# Patient Record
Sex: Female | Born: 1970 | Race: Black or African American | Hispanic: No | Marital: Married | Smoking: Never smoker
Health system: Southern US, Community
[De-identification: ages and names within clinical notes are randomized; demographics above are authoritative.]

## PROBLEM LIST (undated history)

## (undated) DIAGNOSIS — D649 Anemia, unspecified: Secondary | ICD-10-CM

## (undated) HISTORY — PX: OVARIAN CYST SURGERY: SHX726

## (undated) HISTORY — DX: Anemia, unspecified: D64.9

---

## 1998-01-11 ENCOUNTER — Ambulatory Visit (HOSPITAL_COMMUNITY): Admission: RE | Admit: 1998-01-11 | Discharge: 1998-01-11 | Payer: Self-pay | Admitting: Obstetrics and Gynecology

## 1998-01-18 ENCOUNTER — Encounter: Payer: Self-pay | Admitting: Obstetrics and Gynecology

## 1998-01-19 ENCOUNTER — Inpatient Hospital Stay (HOSPITAL_COMMUNITY): Admission: RE | Admit: 1998-01-19 | Discharge: 1998-01-22 | Payer: Self-pay | Admitting: Obstetrics and Gynecology

## 1998-12-12 ENCOUNTER — Other Ambulatory Visit: Admission: RE | Admit: 1998-12-12 | Discharge: 1998-12-12 | Payer: Self-pay | Admitting: Obstetrics and Gynecology

## 1999-01-17 ENCOUNTER — Encounter: Payer: Self-pay | Admitting: Obstetrics and Gynecology

## 1999-01-17 ENCOUNTER — Encounter: Admission: RE | Admit: 1999-01-17 | Discharge: 1999-01-17 | Payer: Self-pay | Admitting: Obstetrics and Gynecology

## 2000-01-23 ENCOUNTER — Other Ambulatory Visit: Admission: RE | Admit: 2000-01-23 | Discharge: 2000-01-23 | Payer: Self-pay | Admitting: Obstetrics and Gynecology

## 2000-02-14 ENCOUNTER — Encounter: Payer: Self-pay | Admitting: Obstetrics and Gynecology

## 2000-02-14 ENCOUNTER — Ambulatory Visit (HOSPITAL_COMMUNITY): Admission: RE | Admit: 2000-02-14 | Discharge: 2000-02-14 | Payer: Self-pay | Admitting: Obstetrics and Gynecology

## 2000-09-06 ENCOUNTER — Encounter: Payer: Self-pay | Admitting: Obstetrics and Gynecology

## 2000-09-06 ENCOUNTER — Ambulatory Visit (HOSPITAL_COMMUNITY): Admission: RE | Admit: 2000-09-06 | Discharge: 2000-09-06 | Payer: Self-pay | Admitting: Obstetrics and Gynecology

## 2001-04-21 ENCOUNTER — Inpatient Hospital Stay (HOSPITAL_COMMUNITY): Admission: AD | Admit: 2001-04-21 | Discharge: 2001-04-23 | Payer: Self-pay | Admitting: Obstetrics and Gynecology

## 2001-04-24 ENCOUNTER — Encounter: Admission: RE | Admit: 2001-04-24 | Discharge: 2001-05-24 | Payer: Self-pay | Admitting: Obstetrics & Gynecology

## 2001-06-03 ENCOUNTER — Other Ambulatory Visit: Admission: RE | Admit: 2001-06-03 | Discharge: 2001-06-03 | Payer: Self-pay | Admitting: Obstetrics & Gynecology

## 2002-07-30 ENCOUNTER — Other Ambulatory Visit: Admission: RE | Admit: 2002-07-30 | Discharge: 2002-07-30 | Payer: Self-pay | Admitting: Obstetrics and Gynecology

## 2003-10-07 ENCOUNTER — Other Ambulatory Visit: Admission: RE | Admit: 2003-10-07 | Discharge: 2003-10-07 | Payer: Self-pay | Admitting: Obstetrics and Gynecology

## 2004-12-01 ENCOUNTER — Other Ambulatory Visit: Admission: RE | Admit: 2004-12-01 | Discharge: 2004-12-01 | Payer: Self-pay | Admitting: Obstetrics and Gynecology

## 2005-03-14 ENCOUNTER — Ambulatory Visit: Payer: Self-pay | Admitting: Family Medicine

## 2005-05-23 ENCOUNTER — Ambulatory Visit: Payer: Self-pay | Admitting: Family Medicine

## 2005-06-06 ENCOUNTER — Encounter: Payer: Self-pay | Admitting: Obstetrics and Gynecology

## 2006-02-05 ENCOUNTER — Inpatient Hospital Stay (HOSPITAL_COMMUNITY): Admission: AD | Admit: 2006-02-05 | Discharge: 2006-02-05 | Payer: Self-pay | Admitting: Obstetrics and Gynecology

## 2006-04-13 ENCOUNTER — Inpatient Hospital Stay (HOSPITAL_COMMUNITY): Admission: AD | Admit: 2006-04-13 | Discharge: 2006-04-15 | Payer: Self-pay | Admitting: Obstetrics and Gynecology

## 2006-04-17 ENCOUNTER — Encounter: Admission: RE | Admit: 2006-04-17 | Discharge: 2006-05-17 | Payer: Self-pay | Admitting: Obstetrics and Gynecology

## 2006-12-31 ENCOUNTER — Emergency Department: Payer: Self-pay | Admitting: Emergency Medicine

## 2008-02-04 ENCOUNTER — Ambulatory Visit: Payer: Self-pay | Admitting: Family Medicine

## 2008-02-04 DIAGNOSIS — D509 Iron deficiency anemia, unspecified: Secondary | ICD-10-CM

## 2008-02-05 LAB — CONVERTED CEMR LAB
ALT: 25 units/L (ref 0–35)
AST: 22 units/L (ref 0–37)
Albumin: 3.9 g/dL (ref 3.5–5.2)
Alkaline Phosphatase: 67 units/L (ref 39–117)
BUN: 9 mg/dL (ref 6–23)
Basophils Relative: 0.3 % (ref 0.0–3.0)
Bilirubin, Direct: 0.1 mg/dL (ref 0.0–0.3)
CO2: 28 meq/L (ref 19–32)
Calcium: 9.1 mg/dL (ref 8.4–10.5)
Chloride: 108 meq/L (ref 96–112)
Cholesterol: 134 mg/dL (ref 0–200)
Creatinine, Ser: 0.8 mg/dL (ref 0.4–1.2)
Eosinophils Relative: 2.3 % (ref 0.0–5.0)
GFR calc Af Amer: 104 mL/min
GFR calc non Af Amer: 86 mL/min
Glucose, Bld: 81 mg/dL (ref 70–99)
HCT: 34.2 % — ABNORMAL LOW (ref 36.0–46.0)
HDL: 67.5 mg/dL (ref >39.0)
Hemoglobin: 11.4 g/dL — ABNORMAL LOW (ref 12.0–15.0)
Iron: 67 ug/dL (ref 42–145)
LDL Cholesterol: 58 mg/dL (ref 0–99)
Lymphocytes Relative: 35.1 % (ref 12.0–46.0)
MCHC: 33.4 g/dL (ref 30.0–36.0)
MCV: 84.9 fL (ref 78.0–100.0)
Monocytes Relative: 8.5 % (ref 3.0–12.0)
Neutrophils Relative %: 53.8 % (ref 43.0–77.0)
Platelets: 174 10*3/uL (ref 150–400)
Potassium: 3.9 meq/L (ref 3.5–5.1)
RBC: 4.03 M/uL (ref 3.87–5.11)
RDW: 13.2 % (ref 11.5–14.6)
Saturation Ratios: 21.8 % (ref 20.0–50.0)
Sodium: 139 meq/L (ref 135–145)
TSH: 1.15 microintl units/mL (ref 0.35–5.50)
Total Bilirubin: 0.6 mg/dL (ref 0.3–1.2)
Total CHOL/HDL Ratio: 2
Total Protein: 7.3 g/dL (ref 6.0–8.3)
Transferrin: 219.9 mg/dL (ref >=212.0)
Triglycerides: 44 mg/dL (ref 0–149)
VLDL: 9 mg/dL (ref 0–40)
WBC: 6 10*3/uL (ref 4.5–10.5)

## 2009-03-24 ENCOUNTER — Encounter: Payer: Self-pay | Admitting: Family Medicine

## 2009-03-25 ENCOUNTER — Ambulatory Visit: Payer: Self-pay | Admitting: Family Medicine

## 2009-03-25 DIAGNOSIS — R4589 Other symptoms and signs involving emotional state: Secondary | ICD-10-CM

## 2009-03-28 LAB — CONVERTED CEMR LAB
ALT: 20 units/L (ref 0–35)
AST: 19 units/L (ref 0–37)
Albumin: 4.1 g/dL (ref 3.5–5.2)
Alkaline Phosphatase: 67 units/L (ref 39–117)
BUN: 6 mg/dL (ref 6–23)
Basophils Relative: 1.2 % (ref 0.0–3.0)
Bilirubin, Direct: 0.1 mg/dL (ref 0.0–0.3)
CO2: 28 meq/L (ref 19–32)
Calcium: 9.1 mg/dL (ref 8.4–10.5)
Chloride: 108 meq/L (ref 96–112)
Cholesterol: 122 mg/dL (ref 0–200)
Creatinine, Ser: 0.8 mg/dL (ref 0.4–1.2)
Eosinophils Relative: 4.3 % (ref 0.0–5.0)
GFR calc non Af Amer: 102.79 mL/min (ref >60)
Glucose, Bld: 78 mg/dL (ref 70–99)
HCT: 35.4 % — ABNORMAL LOW (ref 36.0–46.0)
HDL: 64.8 mg/dL (ref >39.00)
Hemoglobin: 11.5 g/dL — ABNORMAL LOW (ref 12.0–15.0)
LDL Cholesterol: 49 mg/dL (ref 0–99)
Lymphocytes Relative: 34.2 % (ref 12.0–46.0)
MCHC: 32.4 g/dL (ref 30.0–36.0)
MCV: 86 fL (ref 78.0–100.0)
Monocytes Relative: 10 % (ref 3.0–12.0)
Neutrophils Relative %: 50.3 % (ref 43.0–77.0)
Platelets: 158 10*3/uL (ref 150.0–400.0)
Potassium: 3.7 meq/L (ref 3.5–5.1)
RBC: 4.12 M/uL (ref 3.87–5.11)
RDW: 13.8 % (ref 11.5–14.6)
Sodium: 139 meq/L (ref 135–145)
TSH: 1.04 microintl units/mL (ref 0.35–5.50)
Total Bilirubin: 0.4 mg/dL (ref 0.3–1.2)
Total CHOL/HDL Ratio: 2
Total Protein: 7.2 g/dL (ref 6.0–8.3)
Triglycerides: 40 mg/dL (ref 0.0–149.0)
VLDL: 8 mg/dL (ref 0.0–40.0)
WBC: 4.4 10*3/uL — ABNORMAL LOW (ref 4.5–10.5)

## 2009-07-11 ENCOUNTER — Telehealth: Payer: Self-pay | Admitting: Family Medicine

## 2009-07-15 ENCOUNTER — Ambulatory Visit: Payer: Self-pay | Admitting: Family Medicine

## 2009-07-15 DIAGNOSIS — R209 Unspecified disturbances of skin sensation: Secondary | ICD-10-CM

## 2009-07-20 ENCOUNTER — Encounter: Admission: RE | Admit: 2009-07-20 | Discharge: 2009-07-20 | Payer: Self-pay | Admitting: Family Medicine

## 2009-07-22 ENCOUNTER — Telehealth (INDEPENDENT_AMBULATORY_CARE_PROVIDER_SITE_OTHER): Payer: Self-pay | Admitting: *Deleted

## 2009-08-16 ENCOUNTER — Ambulatory Visit: Payer: Self-pay | Admitting: Psychology

## 2009-08-23 ENCOUNTER — Ambulatory Visit: Payer: Self-pay | Admitting: Psychology

## 2009-09-06 ENCOUNTER — Ambulatory Visit: Payer: Self-pay | Admitting: Psychology

## 2009-09-20 ENCOUNTER — Ambulatory Visit: Payer: Self-pay | Admitting: Psychology

## 2010-03-07 NOTE — Progress Notes (Signed)
Summary: MRI results  Phone Note Call from Patient   Caller: Brooke Gill [phone] Call For: Brooke Gill Summary of Call: Husband phoned in saying that his wife is very anxious about the MRI results.  He says he has permission to speak with [place] about her, I'm not sure where to locate that information.  He says she reacts to everything and she is "sick" with worry. Dr. Milinda Antis is out of the office today and all of next week.  I don't know if she will check her desktop while she is away.  Can you provide any reassurance to this Brooke even if  more definitive instructions should come from Dr. Milinda Antis? Initial call taken by: Delilah Shan CMA Duncan Dull),  July 22, 2009 9:24 AM  Follow-up for Phone Call        Please call patient or husband MRI shows no worrisome findings Dr Milinda Antis will review when she is back in about 10 days Follow-up by: Cindee Salt MD,  July 22, 2009 1:17 PM  Additional Follow-up for Phone Call Additional follow up Details #1::        [facility] for husband to call.    Lowella Petties CMA  July 22, 2009 3:11 PM   Jacki Cones- I put comment append to the MRI itself-- thanks --- MT  Additional Follow-up by: Judith Part MD,  July 22, 2009 3:26 PM

## 2010-03-07 NOTE — Assessment & Plan Note (Signed)
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  when she is stressed gets tight chest or palpitations  does not feel like she needs counseling neck gets tight and worse with stress   good support with her husband -- gripe sessions help   started wt watchers - lost 4 lb  walking for exercise   sees gyn for [facility] - Dr Dareen Piano - nl pap  got baseline last year mammogram -- will start regular ones at 40  regular menese   wt is down 1 lb with bmi of 22   Td 2002   did get a flu shot this year   takes mvi regularly with iron -misses a few doses is bordernine anemic   she does try to walk for 30 min at lunch and this helps   Allergies (verified): No Known Drug Allergies  Past History:  Past Medical History: Last updated: 2008-02-13 Anemia  gyn- Dr Dareen Piano   Past Surgical History: Last updated: 02-13-2008 Ovarian Cysts -sx  Family History: Last updated: 2008/02/13 Father- ETOH/ Drugs deceased early Mother- Smoker P Aunt- Breast Ca P GM- Heart Dz P GF- Prostate Ca  Social History: Last updated: 02-13-08 Occupation: Charity fundraiser  Never Smoked Alcohol use-no Drug use-no does not eat vegetables (non tolerant of them)  Risk Factors: Smoking Status: never (13-Feb-2008)  Review of Systems General:  Complains of fatigue; denies fever, loss of appetite, and malaise. Eyes:  Denies blurring, discharge, and eye irritation. CV:  Complains of chest pain or discomfort; denies difficulty breathing  while lying down, fainting, fatigue, lightheadness, and palpitations. Resp:  Denies cough and wheezing. GI:  Denies abdominal pain, bloody stools, change in bowel habits, and indigestion. GU:  Denies abnormal vaginal bleeding, discharge, dysuria, hematuria, and urinary frequency. MS:  Denies joint pain, joint redness, and joint swelling. Derm:  Denies itching, lesion(s), poor wound healing, and rash. Neuro:  Denies numbness and tingling. Psych:  Complains of anxiety; denies panic attacks, sense of great danger, and suicidal thoughts/plans. Endo:  Denies cold intolerance and excessive thirst. Heme:  Denies abnormal bruising and bleeding.  Physical Exam  General:  Well-developed,well-nourished,in no acute distress; alert,appropriate and cooperative throughout examination Head:  normocephalic, atraumatic, and no abnormalities observed.   Eyes:  vision grossly intact, pupils equal, pupils round, and pupils reactive to light.   Ears:  R ear normal and L ear normal.   Nose:  no nasal discharge.   Mouth:  pharynx pink and moist.   Neck:  supple with full rom and no masses or thyromegally, no JVD or carotid bruit  Chest Wall:  No deformities, masses, or tenderness noted. Lungs:  Normal respiratory effort, chest expands symmetrically. Lungs are clear to auscultation, no crackles or wheezes. Heart:  Normal rate and regular rhythm. S1 and S2 normal without gallop, murmur, click, rub  or other extra sounds. Abdomen:  Bowel sounds positive,abdomen soft and non-tender without masses, organomegaly or hernias noted. no renal bruits  Msk:  No deformity or scoliosis noted of thoracic or lumbar spine.  no acute joint changes  Pulses:  R and L carotid,radial,femoral,dorsalis pedis and posterior tibial pulses are full and equal bilaterally Neurologic:  sensation intact to light touch, gait normal, and DTRs symmetrical and normal.   Skin:  Intact without suspicious lesions or rashes some dry skin on feet    Cervical Nodes:  No lymphadenopathy noted Inguinal Nodes:  No significant adenopathy Psych:  normal affect, talkative and pleasant    Impression & Recommendations:  Problem # 1:  HEALTH MAINTENANCE EXAM (ICD-V70.0) Assessment Comment Only  reviewed health habits including diet, exercise and skin cancer prevention reviewed health maintenance list and family history  lab today and update   Orders: Venipuncture (62952) TLB-Lipid Panel (80061-LIPID) TLB-BMP (Basic Metabolic Panel-BMET) (80048-METABOL) TLB-CBC Platelet - w/Differential (85025-CBCD) TLB-Hepatic/Liver Function Pnl (80076-HEPATIC) TLB-TSH (Thyroid Stimulating Hormone) (84443-TSH)  Problem # 2:  ANEMIA, IRON DEFICIENCY (ICD-280.9) Assessment: Unchanged  mild - and chronic and well compensated - likely due to menses  check today and update / esp in light of chest symptoms   Orders: Venipuncture (84132) TLB-Lipid Panel (80061-LIPID) TLB-BMP (Basic Metabolic Panel-BMET) (80048-METABOL) TLB-CBC Platelet - w/Differential (85025-CBCD) TLB-Hepatic/Liver Function Pnl (80076-HEPATIC) TLB-TSH (Thyroid Stimulating Hormone) (84443-TSH)  Hgb: 11.4 (02/04/2008)   Hct: 34.2 (02/04/2008)   Platelets: 174 (02/04/2008) RBC: 4.03 (02/04/2008)   RDW: 13.2 (02/04/2008)   WBC: 6.0 (02/04/2008) MCV: 84.9 (02/04/2008)   MCHC: 33.4 (02/04/2008) Iron: 67 (02/04/2008)   % Sat: 21.8 (02/04/2008) TSH: 1.15 (02/04/2008)  Problem # 3:  STRESS REACTION, ACUTE, WITH EMOTIONAL DISTURBANCE (ICD-308.0) Assessment: New  with work stress disc sit stress/ coping tech/ support sources and opt for tx in detail  pt declines counseling or med at this time  aware she can call if symptoms worsen  given name of integrative therapies to call for neck massage or biofeedback if she is interested in future   Orders: Venipuncture (44010) TLB-Lipid Panel (80061-LIPID) TLB-BMP (Basic Metabolic Panel-BMET) (80048-METABOL) TLB-CBC Platelet - w/Differential  (85025-CBCD) TLB-Hepatic/Liver Function Pnl (80076-HEPATIC) TLB-TSH (Thyroid Stimulating Hormone) (84443-TSH)  Complete Medication List: 1)  Multivitamins Tabs (Multiple vitamin) .... One daily  Patient Instructions: 1)  labs today  2)  if you are interested in physical therapy/ biofeedback / massage - a good place to contact is integrative therapies in [place]  3)  keep up the healthy diet and exercise  4)  EKG is normal today  Current Allergies (reviewed today): No known allergies     EKG  Procedure date:  03/25/2009  Findings:      NSR with rate of 68 and regular no acute changes

## 2010-03-07 NOTE — Assessment & Plan Note (Signed)
Summary: F/U [facility]   Vital Signs:  Patient profile:   40 year old female Height:      66.25 inches Weight:      133.25 pounds BMI:     21.42 Temp:     98.7 degrees F oral Pulse rate:   88 / minute Pulse rhythm:   regular BP sitting:   102 / 68  (left arm) Cuff size:   regular  Vitals Entered By: Lewanda Rife LPN (July 15, 2009 12:21 PM) CC: f/u after [facility] urgent care visit 07/12/09 Numbness rt arm, hand, leg and rt side of face with dizziness and nervous and not eating.   History of Present Illness: went to UC on 7th  symptoms at mall on sat  dizzy and hands tingled - sat for a while  then felt tingling and numbness in R arm and in face   tues got to work - felt like she was going to pass out  hard to explain   still feels a bit tinglng   feels generally very nervous  not eating well   UC -- told to get neurol appt and workup for MS   has been stressed and nervous and anxious got new postion at work -- very stressful -- timeline an deadline driven -- asked to do " the impossible"  some of it is self driven  needs to delegate more - unable to do that  husband went out of town - in [place] -- left on sat   would be open to counseling   never had symptoms like this before   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   Allergies (verified): No Known Drug Allergies  Past History:  Past Medical History: Last updated: 02/23/08 Anemia  gyn- Dr Dareen Piano   Past Surgical History: Last updated: 02/23/2008 Ovarian Cysts -sx  Family History: Last updated: 02-23-08 Father- ETOH/ Drugs deceased early Mother- Smoker P Aunt- Breast Ca P GM- Heart Dz P GF- Prostate Ca  Social History: Last updated: February 23, 2008 Occupation: Charity fundraiser  Never Smoked Alcohol use-no Drug use-no does not eat vegetables (non tolerant of them)  Risk Factors: Smoking Status: never (02-23-08)  Review of Systems General:   Complains of fatigue; denies chills, fever, loss of appetite, and malaise. Eyes:  Denies blurring and eye irritation. CV:  Denies chest pain or discomfort, lightheadness, palpitations, and shortness of breath with exertion. Resp:  Denies cough, shortness of breath, and wheezing. GI:  Denies abdominal pain, change in bowel habits, indigestion, and nausea. GU:  Denies dysuria and urinary frequency. MS:  Denies joint pain, joint redness, joint swelling, cramps, and muscle weakness. Derm:  Denies itching, lesion(s), poor wound healing, and rash. Neuro:  Complains of numbness and tingling; denies headaches, tremors, visual disturbances, and weakness. Psych:  Complains of anxiety; denies panic attacks, sense of great danger, and suicidal thoughts/plans. Endo:  Denies cold intolerance, excessive thirst, excessive urination, and heat intolerance. Heme:  Denies abnormal bruising and bleeding.  Physical Exam  General:  Well-developed,well-nourished,in no acute distress; alert,appropriate and cooperative throughout examination Head:  normocephalic, atraumatic, and no abnormalities observed.  no sinus or temporal tenderness  Eyes:  vision grossly intact, pupils equal, pupils round, and pupils reactive to light.  fundi grossly wnl  no nystagmus  Ears:  R ear normal and L ear normal.   Nose:  no nasal discharge.   Mouth:  pharynx pink and moist.   Neck:  supple with full  rom and no masses or thyromegally, no JVD or carotid bruit  Chest Wall:  No deformities, masses, or tenderness noted. Lungs:  Normal respiratory effort, chest expands symmetrically. Lungs are clear to auscultation, no crackles or wheezes. Heart:  Normal rate and regular rhythm. S1 and S2 normal without gallop, murmur, click, rub or other extra sounds. Abdomen:  soft, non-tender, and normal bowel sounds.   Msk:  No deformity or scoliosis noted of thoracic or lumbar spine.   Pulses:  R and L carotid,radial,femoral,dorsalis pedis and  posterior tibial pulses are full and equal bilaterally Neurologic:   mildly dec sensation to light touch R forearm  rest of sensation is full bilaterally alert & oriented X3, cranial nerves II-XII intact, strength normal in all extremities, gait normal, DTRs symmetrical and normal, finger-to-nose normal, toes down bilaterally on Babinski, and Romberg negative.   Skin:  Intact without suspicious lesions or rashes Cervical Nodes:  No lymphadenopathy noted Inguinal Nodes:  No significant adenopathy Psych:  quite anxious- almost tearful at times eye contact and comm skills are fair    Impression & Recommendations:  Problem # 1:  PARESTHESIA (ICD-782.0) Assessment New R parethesias  pend UC reports  suspect has to do with stress rxn and anxiety  less likely - neurol process like MS will ref for brain mri for this and update Orders: Psychology Referral (Psychology) Radiology Referral (Radiology)  Problem # 2:  STRESS REACTION, ACUTE, WITH EMOTIONAL DISTURBANCE (ICD-308.0) Assessment: Deteriorated ref to counseling disc stressors/ coping skills/ support/symptoms and tx opt in detail  spent 25 minutes face to face time with pt , over 50% of which was spent on counseling and coordination of care    Orders: Psychology Referral (Psychology)  Complete Medication List: 1)  Multivitamins Tabs (Multiple vitamin) .... One daily  Patient Instructions: 1)  send me a copy of labs when they come back from work  2)  we will set up MRI at check out  3)  we will do counseling referral at check out  4)  update me if symptoms worsen  5)  will make a plan when tests return   Current Allergies (reviewed today): No known allergies

## 2010-03-07 NOTE — Progress Notes (Signed)
Summary: NUMBNESS AND TINGLING RIGHT HAND   Phone Note Call from Patient   Summary of Call: Pt called, having numbness and tingling in her right hand, refused office visit. Wants to speak w/ a nurse first, no pain. Call back # [phone] or [phone] -Pharmacy [facility].Daine Gip  July 11, 2009 9:42 AM  Initial call taken by: Daine Gip,  July 11, 2009 9:42 AM  Follow-up for Phone Call        Spoke with pt.  She says she thinks she is going to go to an urgent care and then follow up with [place].  I [facility].  thanks for update  Follow-up by: Lowella Petties CMA,  July 11, 2009 10:00 AM

## 2010-06-10 ENCOUNTER — Encounter: Payer: Self-pay | Admitting: Family Medicine

## 2010-06-19 ENCOUNTER — Ambulatory Visit (INDEPENDENT_AMBULATORY_CARE_PROVIDER_SITE_OTHER): Payer: BC Managed Care – PPO | Admitting: Family Medicine

## 2010-06-19 ENCOUNTER — Encounter: Payer: Self-pay | Admitting: Family Medicine

## 2010-06-19 DIAGNOSIS — Z0000 Encounter for general adult medical examination without abnormal findings: Secondary | ICD-10-CM

## 2010-06-19 DIAGNOSIS — D509 Iron deficiency anemia, unspecified: Secondary | ICD-10-CM

## 2010-06-19 DIAGNOSIS — Z23 Encounter for immunization: Secondary | ICD-10-CM

## 2010-06-19 DIAGNOSIS — R4589 Other symptoms and signs involving emotional state: Secondary | ICD-10-CM

## 2010-06-19 NOTE — Assessment & Plan Note (Signed)
Reviewed health habits including diet and exercise and skin cancer prevention Also reviewed health mt list, fam hx and immunizations  Update Tdap today  Lab for wellness Chol good last year

## 2010-06-19 NOTE — Progress Notes (Signed)
Subjective:    Patient ID: Brooke Gill, female    DOB: [birth year], 40 y.o.   MRN: [id number]  HPI Here for annual wellness exam and also to review chronic med problems Is doing ok overall  Nothing new going in   Sees gyn for exam-- was last July  Periods are pretty normal  Does not like the side eff of OC - and does not want BTL  Gyn- Dr Dareen Piano - last pap was nl  Nl baseline mam at 38 and will set up this summer in the office   Has 2 girls age 67 and 9--is hectic     Anemia - mild in past Energy level is fair  Does occ miss her vitamin (mvi)  Last check hb was 11.5  Hx of stress rxn- was ref to counseling  Still very stressful - is dealing with it a lot better  Better perspective  Has not looked for other work  No more paresthesia or other symptoms   Wt is up 4 lb with bmi of 21 Does zumba classes sporatically   Good chol in 2011 Lab Results  Component Value Date   CHOL 122 03/25/2009   CHOL 134 02/04/2008   Lab Results  Component Value Date   HDL 64.80 03/25/2009   HDL 29.5 02/04/2008   Lab Results  Component Value Date   LDLCALC 49 03/25/2009   LDLCALC 58 02/04/2008   Lab Results  Component Value Date   TRIG 40.0 03/25/2009   TRIG 44 02/04/2008   Lab Results  Component Value Date   CHOLHDL 2 03/25/2009   CHOLHDL 2.0 CALC 02/04/2008   No results found for this basename: LDLDIRECT   Past Medical History  Diagnosis Date  . Anemia     Past Surgical History  Procedure Date  . Ovarian cyst surgery     History   Social History  . Marital Status: Married    Spouse Name: N/A    Number of Children: N/A  . Years of Education: N/A   Occupational History  . Not on file.   Social History Main Topics  . Smoking status: Never Smoker   . Smokeless tobacco: Not on file  . Alcohol Use: No  . Drug Use: No  . Sexually Active:    Other Topics Concern  . Not on file   Social History Narrative  . No narrative on file    Family History  Problem  Relation Age of Onset  . Alcohol abuse Father   . Cancer Paternal Aunt     breast  . Heart disease Paternal Grandmother   . Cancer Paternal Grandfather     prostate CA       Review of Systems Review of Systems  Constitutional: Negative for fever, appetite change, fatigue and unexpected weight change.  Eyes: Negative for pain and visual disturbance.  Respiratory: Negative for cough and shortness of breath.   Cardiovascular: Negative.   Gastrointestinal: Negative for nausea, diarrhea and constipation.  Genitourinary: Negative for urgency and frequency.  Skin: Negative for pallor.  Neurological: Negative for weakness, light-headedness, numbness and headaches.  Hematological: Negative for adenopathy. Does not bruise/bleed easily.  Psychiatric/Behavioral: Negative for dysphoric mood. The patient is not nervous/anxious.          Objective:   Physical Exam  Constitutional: She appears well-developed and well-nourished. No distress.  HENT:  Head: Normocephalic and atraumatic.  Right Ear: External ear normal.  Left Ear: External ear normal.  Nose: Nose  normal.  Mouth/Throat: Oropharynx is clear and moist.  Eyes: Conjunctivae and EOM are normal. Pupils are equal, round, and reactive to light.  Neck: Normal range of motion. Neck supple. No JVD present. Carotid bruit is not present. No thyromegaly present.  Cardiovascular: Normal rate, regular rhythm and normal heart sounds.   Pulmonary/Chest: Effort normal and breath sounds normal. No respiratory distress. She has no wheezes.  Abdominal: Soft. Bowel sounds are normal. She exhibits no distension and no mass. There is no tenderness.  Musculoskeletal: Normal range of motion. She exhibits no edema and no tenderness.  Lymphadenopathy:    She has no cervical adenopathy.  Neurological: She is alert. She has normal reflexes. Coordination normal.  Skin: Skin is warm and dry. No rash noted. No erythema. No pallor.  Psychiatric: She has a  normal mood and affect.          Assessment & Plan:

## 2010-06-19 NOTE — Assessment & Plan Note (Signed)
Check cbc  Takes vitamins sporatically  Suspect from menses

## 2010-06-19 NOTE — Patient Instructions (Signed)
Keep up healthy habits and healthy diet  Wellness labs today  Try to keep life as balanced as possible

## 2010-06-19 NOTE — Assessment & Plan Note (Signed)
Much improved - good coping skills and outlook Disc imp of self care

## 2010-06-20 LAB — COMPREHENSIVE METABOLIC PANEL
AST: 17 U/L (ref 0–37)
Alkaline Phosphatase: 57 U/L (ref 39–117)
BUN: 10 mg/dL (ref 6–23)
Creatinine, Ser: 0.7 mg/dL (ref 0.4–1.2)

## 2010-06-20 LAB — CBC WITH DIFFERENTIAL/PLATELET
Basophils Absolute: 0.1 10*3/uL (ref 0.0–0.1)
Basophils Relative: 0.8 % (ref 0.0–3.0)
Eosinophils Absolute: 0.2 10*3/uL (ref 0.0–0.7)
Hemoglobin: 10.4 g/dL — ABNORMAL LOW (ref 12.0–15.0)
MCHC: 34 g/dL (ref 30.0–36.0)
MCV: 85 fl (ref 78.0–100.0)
Monocytes Absolute: 0.4 10*3/uL (ref 0.1–1.0)
Neutro Abs: 3.4 10*3/uL (ref 1.4–7.7)
Neutrophils Relative %: 47 % (ref 43.0–77.0)
RBC: 3.59 Mil/uL — ABNORMAL LOW (ref 3.87–5.11)
RDW: 14 % (ref 11.5–14.6)

## 2010-06-22 ENCOUNTER — Telehealth: Payer: Self-pay

## 2010-06-22 NOTE — Telephone Encounter (Signed)
Message copied by Lewanda Rife on Thu Jun 22, 2010  6:37 PM ------      Message from: Roxy Manns      Created: Thu Jun 22, 2010  2:58 PM       Anemia is worse      This could make her tired      Get otc ferrous sulfate 325 mg and take 1 po qd       May need stool softener also if this causes constipation       Can also eat iron rich foods- beef/ dark leafy greens      Re check cbc with diff for anemia in 6 weeks

## 2010-06-22 NOTE — Telephone Encounter (Signed)
Patient notified as instructed by telephone. Lab appt scheduled as instructed. 08/11/10 at 4pm.

## 2010-06-23 NOTE — Discharge Summary (Signed)
Brooke Gill, BODKINS NO.:  [id number]   MEDICAL RECORD NO.:  [id number]          PATIENT TYPE:  INP   LOCATION:  9108                          FACILITY:  [facility]   PHYSICIAN:  Gerrit Friends. Aldona Bar, M.D.   DATE OF BIRTH:  [birth year]   DATE OF ADMISSION:  04/13/2006  DATE OF DISCHARGE:                               DISCHARGE SUMMARY   DISCHARGE DIAGNOSES:  1. Term pregnancy, delivered, 6-pound 14-ounce female infant, Apgars 9      and 9.  2. Blood type A positive.   PROCEDURES:  Normal spontaneous delivery.   SUMMARY:  This 40 year old gravida 4, now para 2, abortus 2 was admitted  in active labor on April 13, 2006, after a benign pregnancy.  She  required some Pitocin augmentation but subsequently had a normal  spontaneous delivery of viable 6-pound 14-ounce female infant with Apgar  hours of 9 and 9 over an intact perineum.  Her postpartum course was  benign.  Her discharge hemoglobin was 10.0 with a white count of 15,800  and a platelet count of 136,000.  On the morning of March 10 she was  ambulating well, tolerating a regular diet well, having normal bowel and  bladder function, was afebrile, her breast-feeding was going well, and  she was desirous of discharge.  Accordingly, she was given all  appropriate instructions per discharge brochure and discharged to home.  Return to the office will be scheduled in approximately four weeks'  time.  Discharge medications include vitamins - one a day and Feosol  capsules - one a day.  She will use over-the-counter Advil as needed for  cramping or discomfort.   CONDITION ON DISCHARGE:  Improved.      Gerrit Friends. Aldona Bar, M.D.  Electronically Signed     RMW/MEDQ  D:  04/15/2006  T:  04/15/2006  Job:  [id number]

## 2010-08-11 ENCOUNTER — Ambulatory Visit (INDEPENDENT_AMBULATORY_CARE_PROVIDER_SITE_OTHER): Payer: BC Managed Care – PPO | Admitting: Family Medicine

## 2010-08-11 ENCOUNTER — Other Ambulatory Visit (INDEPENDENT_AMBULATORY_CARE_PROVIDER_SITE_OTHER): Payer: BC Managed Care – PPO | Admitting: Family Medicine

## 2010-08-11 ENCOUNTER — Encounter: Payer: Self-pay | Admitting: Family Medicine

## 2010-08-11 ENCOUNTER — Ambulatory Visit: Payer: BC Managed Care – PPO

## 2010-08-11 VITALS — BP 108/80 | HR 108 | Temp 98.1°F

## 2010-08-11 DIAGNOSIS — R000 Tachycardia, unspecified: Secondary | ICD-10-CM

## 2010-08-11 DIAGNOSIS — D509 Iron deficiency anemia, unspecified: Secondary | ICD-10-CM

## 2010-08-11 DIAGNOSIS — D649 Anemia, unspecified: Secondary | ICD-10-CM

## 2010-08-11 LAB — CBC WITH DIFFERENTIAL/PLATELET
Eosinophils Relative: 1 % (ref 0–5)
HCT: 32 % — ABNORMAL LOW (ref 36.0–46.0)
Hemoglobin: 10.6 g/dL — ABNORMAL LOW (ref 12.0–15.0)
Lymphocytes Relative: 36 % (ref 12–46)
Lymphs Abs: 2.5 10*3/uL (ref 0.7–4.0)
MCV: 83.1 fL (ref 78.0–100.0)
Monocytes Absolute: 0.6 10*3/uL (ref 0.1–1.0)
Monocytes Relative: 8 % (ref 3–12)
RBC: 3.85 MIL/uL — ABNORMAL LOW (ref 3.87–5.11)
WBC: 7.1 10*3/uL (ref 4.0–10.5)

## 2010-08-11 LAB — POCT HEMOGLOBIN: Hemoglobin: 11.1

## 2010-08-11 MED ORDER — BUSPIRONE HCL 15 MG PO TABS: 7.5000 mg | ORAL_TABLET | Freq: Two times a day (BID) | ORAL | Status: AC

## 2010-08-11 NOTE — Assessment & Plan Note (Signed)
Hb by quick test had imp to 11.1 (last 10.4) from heavy menses Pend cbc also  rec pt inc her iron to 325 mg daily  Then disc at f/u

## 2010-08-11 NOTE — Patient Instructions (Signed)
Try buspar for anxiety/ stress reaction  Drink lots of fluids  We will update you with cbc report  Increase iron to 325 mg daily ferrous sulfate  Follow up in 1-2 weeks with me

## 2010-08-11 NOTE — Assessment & Plan Note (Signed)
With EKG HR 102 and nl rhythm-- no acute changes  Suspect this is more likely anxiety related  Hb is not lower  Disc pros and cons of med for anx  Trial of buspar- pt is considering it -- may or may not fill Disc poss side eff in detail Has done counseling F/u in 1-2 wk If hr not lower- consider further w/u Also rev labs from may -all nl except cbc

## 2010-08-11 NOTE — Progress Notes (Signed)
Subjective:    Patient ID: Brooke Gill, female    DOB: 05-Apr-1970, 40 y.o.   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 Some advice (has been months) gmother passed unexpectedly   She would be open to med if necessary   Does not sweat No shakes  No caffiene   Did spot hb today 11.1 This is reassuring   Patient Active Problem List  Diagnoses  . ANEMIA, IRON DEFICIENCY  . STRESS REACTION, ACUTE, WITH EMOTIONAL DISTURBANCE  . PARESTHESIA  . Routine general medical examination at a health care facility  . Tachycardia   Past Medical History  Diagnosis Date  . Anemia    Past Surgical History  Procedure Date  . Ovarian cyst surgery    History  Substance Use Topics  . Smoking status: Never Smoker   . Smokeless tobacco: Not on file  . Alcohol Use: No   Family History  Problem Relation Age of Onset  . Alcohol abuse Father   . Cancer Paternal Aunt     breast  . Heart disease Paternal Grandmother   . Cancer Paternal Grandfather     prostate CA   No Known Allergies Current Outpatient Prescriptions on File Prior to Visit  Medication Sig Dispense Refill  . Multiple Vitamin (MULTIVITAMIN) capsule Take 1 capsule by mouth daily.                Review of Systems Review of Systems  Constitutional: Negative for fever, appetite change, and unexpected weight change. pos for fatigue which has imp Eyes:  Negative for pain and visual disturbance.  Respiratory: Negative for cough and shortness of breath.   Cardiovascular: Negative.for cp or sob or edema or exertional symptoms or sweating/ nausea    Gastrointestinal: Negative for nausea, diarrhea and constipation.  Genitourinary: Negative for urgency and frequency.  Skin: Negative for pallor. or rash  Neurological: Negative for weakness, light-headedness, numbness and headaches.  Hematological: Negative for adenopathy. Does not bruise/bleed easily.  Psychiatric/Behavioral: pos for stress/ anxiety and type A personality        Objective:   Physical Exam  Constitutional: She appears well-developed and well-nourished. No distress.  HENT:  Head: Normocephalic and atraumatic.  Mouth/Throat: Oropharynx is clear and moist.  Eyes: Conjunctivae and EOM are normal. Pupils are equal, round, and reactive to light.  Neck: Normal range of motion. Neck supple. No JVD present. Carotid bruit is not present.  Cardiovascular: Regular rhythm, normal heart sounds and intact distal pulses.  Exam reveals no gallop and no friction rub.   No murmur heard.      tachycardic  Pulmonary/Chest: Effort normal and breath sounds normal. No respiratory distress. She has no wheezes. She has no rales. She exhibits no tenderness.  Abdominal: Soft. Bowel sounds are normal. She exhibits no  distension and no mass. There is no tenderness.  Musculoskeletal: She exhibits no edema and no tenderness.  Lymphadenopathy:    She has no cervical adenopathy.  Neurological: She is alert. She has normal reflexes. Coordination normal.       No tremor   Skin: Skin is warm and dry. No rash noted. No erythema. No pallor.  Psychiatric:       Seems generally keyed up and a bit anxious  Nl eye contact and comm skills Answers/asks app questions            Assessment & Plan:

## 2010-08-25 ENCOUNTER — Ambulatory Visit (INDEPENDENT_AMBULATORY_CARE_PROVIDER_SITE_OTHER): Payer: BC Managed Care – PPO | Admitting: Family Medicine

## 2010-08-25 ENCOUNTER — Encounter: Payer: Self-pay | Admitting: Family Medicine

## 2010-08-25 DIAGNOSIS — D509 Iron deficiency anemia, unspecified: Secondary | ICD-10-CM

## 2010-08-25 DIAGNOSIS — R4589 Other symptoms and signs involving emotional state: Secondary | ICD-10-CM

## 2010-08-25 DIAGNOSIS — R000 Tachycardia, unspecified: Secondary | ICD-10-CM

## 2010-08-25 NOTE — Progress Notes (Signed)
Subjective:    Patient ID: Brooke Gill, female    DOB: 1971/01/26, 40 y.o.   MRN: [id number]  HPI Here for f/u of anemia and anxiety/ tachycardia   Back on iron 325 daily  Is doing ok with the iron  No constipation- in fact some loose stools  Hb was 10.6 on re check   Last visit pulse 100- now is 88  Disc anx and gave px for buspar Tried it and did not like it  Made her face twitch  Make her dizzy  Helps anx some - but too many side effects   Going to have yoga at her workplace  Has done counseling in the past - did not help much- was not very open to it  Some stress -next week will be tough  May consider just cutting the buspar in 1/2   Patient Active Problem List  Diagnoses  . ANEMIA, IRON DEFICIENCY  . STRESS REACTION, ACUTE, WITH EMOTIONAL DISTURBANCE  . PARESTHESIA  . Routine general medical examination at a health care facility  . Tachycardia   Past Medical History  Diagnosis Date  . Anemia    Past Surgical History  Procedure Date  . Ovarian cyst surgery    History  Substance Use Topics  . Smoking status: Never Smoker   . Smokeless tobacco: Not on file  . Alcohol Use: No   Family History  Problem Relation Age of Onset  . Alcohol abuse Father   . Cancer Paternal Aunt     breast  . Heart disease Paternal Grandmother   . Cancer Paternal Grandfather     prostate CA   No Known Allergies Current Outpatient Prescriptions on File Prior to Visit  Medication Sig Dispense Refill  . busPIRone (BUSPAR) 15 MG tablet Take 0.5 tablets (7.5 mg total) by mouth 2 (two) times daily.  30 tablet  5  . Multiple Vitamin (MULTIVITAMIN) capsule Take 1 capsule by mouth daily.             Review of Systems Review of Systems  Constitutional: Negative for fever, appetite change,  and unexpected weight change. less fatigue with iron  Eyes: Negative for pain and visual disturbance.  Respiratory: Negative for cough and shortness of breath.   Cardiovascular: Negative.   for cp or palpitations or sob Gastrointestinal: Negative for nausea, diarrhea and constipation.  Genitourinary: Negative for urgency and frequency.  Skin: Negative for pallor.  Neurological: Negative for weakness, light-headedness, numbness and headaches.  Hematological: Negative for adenopathy. Does not bruise/bleed easily.  Psychiatric/Behavioral: Negative for dysphoric mood. Anxiety is much improved .          Objective:   Physical Exam  Constitutional: She appears well-developed and well-nourished. No distress.  HENT:  Head: Normocephalic and atraumatic.  Right Ear: External ear normal.  Left Ear: External ear normal.  Nose: Nose normal.  Mouth/Throat: Oropharynx is clear and moist.  Eyes: Conjunctivae and EOM are normal. Pupils are equal, round, and reactive to light.       No conj pallor  Neck: Normal range of motion. Neck supple. No JVD present. Carotid bruit is not present. No thyromegaly present.  Cardiovascular: Normal rate, regular rhythm, normal heart sounds and intact distal pulses.   Pulmonary/Chest: Breath sounds normal. No respiratory distress. She has no wheezes.  Abdominal: Soft. Bowel sounds are normal. She exhibits no distension and no mass. There is no tenderness.  Lymphadenopathy:    She has no cervical adenopathy.  Neurological: She is  alert. She has normal reflexes.       No tremor   Skin: Skin is warm and dry. No rash noted. No erythema. No pallor.  Psychiatric: She has a normal mood and affect.          Assessment & Plan:

## 2010-08-25 NOTE — Assessment & Plan Note (Signed)
This is resolved with resolution of anxiety

## 2010-08-25 NOTE — Assessment & Plan Note (Signed)
Anxiety is improved with buspar - pt does not like side eff however She may try to cut dose in 1/2 - that is ok / or stop alltogether and try non pharm opt  Disc meditation/yoga / counseling  Coping mech- disc in detail Will keep me updated

## 2010-08-25 NOTE — Patient Instructions (Signed)
Increase your iron (ferrous sulfate 325 ) to one pill twice daily  Stop the buspar if side effects are not worth the benefit  Schedule labs in 6 weeks for cbc - and will update you  Do your yoga  If you are interested in counseling in the future - let me know

## 2010-08-25 NOTE — Assessment & Plan Note (Signed)
Overall tolerating the 325 ferrous sulfate without constipation - will inc to bid  More energy Hb last was 10.6 (from menses) Re check this in 6 wk and expect improvement

## 2010-10-06 ENCOUNTER — Other Ambulatory Visit: Payer: BC Managed Care – PPO

## 2010-12-11 ENCOUNTER — Other Ambulatory Visit: Payer: Self-pay | Admitting: Obstetrics and Gynecology

## 2012-01-27 ENCOUNTER — Telehealth: Payer: Self-pay | Admitting: Family Medicine

## 2012-01-27 DIAGNOSIS — Z0000 Encounter for general adult medical examination without abnormal findings: Secondary | ICD-10-CM

## 2012-01-27 NOTE — Telephone Encounter (Signed)
Message copied by Judy Pimple on Sun Jan 27, 2012  3:22 PM ------      Message from: Baldomero Lamy      Created: Tue Jan 22, 2012 12:42 PM      Regarding: Cpx labs Mon 12/23       Please order  future cpx labs for pt's upcoming lab appt.      Thanks      Rodney Booze

## 2012-01-28 ENCOUNTER — Other Ambulatory Visit: Payer: BC Managed Care – PPO

## 2012-02-01 ENCOUNTER — Other Ambulatory Visit (INDEPENDENT_AMBULATORY_CARE_PROVIDER_SITE_OTHER): Payer: BC Managed Care – PPO

## 2012-02-01 DIAGNOSIS — Z0000 Encounter for general adult medical examination without abnormal findings: Secondary | ICD-10-CM

## 2012-02-01 LAB — CBC WITH DIFFERENTIAL/PLATELET
Eosinophils Relative: 3.3 % (ref 0.0–5.0)
HCT: 34.2 % — ABNORMAL LOW (ref 36.0–46.0)
Hemoglobin: 11.3 g/dL — ABNORMAL LOW (ref 12.0–15.0)
Lymphs Abs: 2.3 10*3/uL (ref 0.7–4.0)
Monocytes Relative: 6.6 % (ref 3.0–12.0)
Neutro Abs: 3.1 10*3/uL (ref 1.4–7.7)
Platelets: 210 10*3/uL (ref 150.0–400.0)
RBC: 4.05 Mil/uL (ref 3.87–5.11)
WBC: 5.9 10*3/uL (ref 4.5–10.5)

## 2012-02-01 LAB — COMPREHENSIVE METABOLIC PANEL
CO2: 23 mEq/L (ref 19–32)
GFR: 93.21 mL/min (ref >60.00)
Glucose, Bld: 92 mg/dL (ref 70–99)
Sodium: 138 mEq/L (ref 135–145)
Total Bilirubin: 0.5 mg/dL (ref 0.3–1.2)
Total Protein: 7.6 g/dL (ref 6.0–8.3)

## 2012-02-01 LAB — TSH: TSH: 1.31 u[IU]/mL (ref 0.35–5.50)

## 2012-02-01 LAB — LIPID PANEL: Cholesterol: 140 mg/dL (ref 0–200)

## 2012-02-04 ENCOUNTER — Ambulatory Visit (INDEPENDENT_AMBULATORY_CARE_PROVIDER_SITE_OTHER): Payer: BC Managed Care – PPO | Admitting: Family Medicine

## 2012-02-04 ENCOUNTER — Encounter: Payer: Self-pay | Admitting: Family Medicine

## 2012-02-04 VITALS — BP 104/66 | HR 79 | Temp 98.5°F | Ht 66.75 in | Wt 153.0 lb

## 2012-02-04 DIAGNOSIS — Z0000 Encounter for general adult medical examination without abnormal findings: Secondary | ICD-10-CM

## 2012-02-04 NOTE — Assessment & Plan Note (Signed)
Reviewed health habits including diet and exercise and skin cancer prevention Also reviewed health mt list, fam hx and immunizations  Sees gyn for gyn care and mammogram  Anemia is stable with mvi with iron Rev wellness labs in detail She will make appt with Dr Patsy Lager for hip problems if needed

## 2012-02-04 NOTE — Progress Notes (Signed)
Subjective:    Patient ID: Brooke Gill, female    DOB: [birth year], 40 y.o.   MRN: [id number]  HPI Here for health maintenance exam and to review chronic medical problems    Has been doing well overall   Went to UC and dx with hip bursitis - took nsaid and better but nagging  Had one injection Other hip - dull pain - a little for past 2 weeks or so  Does a Jillian M - burn  Also had cortisone shot in foot- dx with "inflammation" - due to shoes - and thinks it caused some discoloration   Wt is up 16 lb with bmi of 24 Takes care of herself - as much as possible   bp good 104/66  Flu vaccine- had that in oct   Pap was 11/13- was normal with her gyn  No period problems   Mammogram - had one was last month and all was normal   Hx of anemia  On ferrous sulfate Lab Results  Component Value Date   WBC 5.9 02/01/2012   HGB 11.3* 02/01/2012   HCT 34.2* 02/01/2012   MCV 84.6 02/01/2012   PLT 210.0 02/01/2012   is taking a mvi with iron  Periods are pretty heavy and was on menses when she did her blood work  Other labs ok   Lab Results  Component Value Date   CHOL 140 02/01/2012   HDL 69.60 02/01/2012   LDLCALC 63 02/01/2012   TRIG 36.0 02/01/2012   CHOLHDL 2 02/01/2012    This is a very good profile Diet is not always optimal She does not like vegetables  Patient Active Problem List  Diagnosis  . ANEMIA, IRON DEFICIENCY  . STRESS REACTION, ACUTE, WITH EMOTIONAL DISTURBANCE  . PARESTHESIA  . Routine general medical examination at a health care facility  . Tachycardia   Past Medical History  Diagnosis Date  . Anemia    Past Surgical History  Procedure Date  . Ovarian cyst surgery    History  Substance Use Topics  . Smoking status: Never Smoker   . Smokeless tobacco: Not on file  . Alcohol Use: No   Family History  Problem Relation Age of Onset  . Alcohol abuse Father   . Cancer Paternal Aunt     breast  . Heart disease Paternal Grandmother   .  Cancer Paternal Grandfather     prostate [place]   No Known Allergies Current Outpatient Prescriptions on File Prior to Visit  Medication Sig Dispense Refill  . ferrous sulfate 325 (65 FE) MG tablet Take 325 mg by mouth 2 (two) times daily.        . Multiple Vitamin (MULTIVITAMIN) capsule Take 1 capsule by mouth daily.           Patient Active Problem List  Diagnosis  . ANEMIA, IRON DEFICIENCY  . STRESS REACTION, ACUTE, WITH EMOTIONAL DISTURBANCE  . PARESTHESIA  . Routine general medical examination at a health care facility  . Tachycardia   Past Medical History  Diagnosis Date  . Anemia    Past Surgical History  Procedure Date  . Ovarian cyst surgery    History  Substance Use Topics  . Smoking status: Never Smoker   . Smokeless tobacco: Not on file  . Alcohol Use: No   Family History  Problem Relation Age of Onset  . Alcohol abuse Father   . Cancer Paternal Aunt     breast  . Heart disease Paternal  Grandmother   . Cancer Paternal Grandfather     prostate CA   No Known Allergies   Review of Systems Review of Systems  Constitutional: Negative for fever, appetite change, fatigue and unexpected weight change.  Eyes: Negative for pain and visual disturbance.  Respiratory: Negative for cough and shortness of breath.   Cardiovascular: Negative for cp or palpitations    Gastrointestinal: Negative for nausea, diarrhea and constipation.  Genitourinary: Negative for urgency and frequency.  Skin: Negative for pallor or rash   MSK pos for hip pain / stiffness/ neg for joint swelling or redness  Neurological: Negative for weakness, light-headedness, numbness and headaches.  Hematological: Negative for adenopathy. Does not bruise/bleed easily.  Psychiatric/Behavioral: Negative for dysphoric mood. The patient is not nervous/anxious.         Objective:   Physical Exam  Constitutional: She appears well-developed and well-nourished. No distress.  HENT:  Head: Normocephalic  and atraumatic.  Right Ear: External ear normal.  Left Ear: External ear normal.  Nose: Nose normal.  Mouth/Throat: Oropharynx is clear and moist. No oropharyngeal exudate.  Eyes: Conjunctivae normal and EOM are normal. Pupils are equal, round, and reactive to light. Right eye exhibits no discharge. Left eye exhibits no discharge. No scleral icterus.  Neck: Normal range of motion. Neck supple. No JVD present. Carotid bruit is not present. No thyromegaly present.  Cardiovascular: Normal rate, regular rhythm, normal heart sounds and intact distal pulses.  Exam reveals no gallop.   Pulmonary/Chest: Effort normal and breath sounds normal. No respiratory distress. She has no wheezes.  Abdominal: Soft. Bowel sounds are normal. She exhibits no distension, no abdominal bruit and no mass. There is no tenderness.  Musculoskeletal: Normal range of motion. She exhibits no edema and no tenderness.       Normal rom hips today  Lymphadenopathy:    She has no cervical adenopathy.  Neurological: She is alert. She has normal reflexes. No cranial nerve deficit. She exhibits normal muscle tone. Coordination normal.  Skin: Skin is warm and dry. No rash noted. No erythema. No pallor.  Psychiatric: She has a normal mood and affect.          Assessment & Plan:

## 2012-02-04 NOTE — Patient Instructions (Addendum)
Keep taking care of yourself  Think about trying some vegetables Try to exercise regularly- mix it up -do not do the same thing all the time  If hip pain continues - make appt with Dr Patsy Lager here

## 2012-10-08 ENCOUNTER — Encounter: Payer: Self-pay | Admitting: Family Medicine

## 2012-10-08 ENCOUNTER — Ambulatory Visit (INDEPENDENT_AMBULATORY_CARE_PROVIDER_SITE_OTHER): Payer: BC Managed Care – PPO | Admitting: Family Medicine

## 2012-10-08 VITALS — BP 110/72 | HR 72 | Temp 98.5°F | Ht 66.5 in | Wt 151.0 lb

## 2012-10-08 DIAGNOSIS — M62838 Other muscle spasm: Secondary | ICD-10-CM

## 2012-10-08 DIAGNOSIS — Z23 Encounter for immunization: Secondary | ICD-10-CM

## 2012-10-08 NOTE — Progress Notes (Signed)
  Subjective:    Patient ID: Brooke Gill, female    DOB: 1971-01-02, 42 y.o.   MRN: [id number]  HPI Here with neck pain  Going on for 2-3 months  Woke up with bad pain - hurt to move her neck to the R (tilt or turn) Has horrible flat pillows   Overall improved from early on  Took naproxen and used a heating pad and kept stretching and rubbing it   Now hurts primarily over R side of neck around occiput  Discomfort to fully flex (less to to fully extend)  Now can turn head , tilting is uncomfortable  Pain rad into shoulder area (trap) No numbness or loss of strength   Patient Active Problem List   Diagnosis Date Noted  . Tachycardia 08/11/2010  . Routine general medical examination at a health care facility 06/19/2010  . PARESTHESIA 07/15/2009  . STRESS REACTION, ACUTE, WITH EMOTIONAL DISTURBANCE 03/25/2009  . ANEMIA, IRON DEFICIENCY 02/04/2008   Past Medical History  Diagnosis Date  . Anemia    Past Surgical History  Procedure Laterality Date  . Ovarian cyst surgery     History  Substance Use Topics  . Smoking status: Never Smoker   . Smokeless tobacco: Not on file  . Alcohol Use: No   Family History  Problem Relation Age of Onset  . Alcohol abuse Father   . Cancer Paternal Aunt     breast  . Heart disease Paternal Grandmother   . Cancer Paternal Grandfather     prostate CA   No Known Allergies No current outpatient prescriptions on file prior to visit.   No current facility-administered medications on file prior to visit.      Review of Systems Review of Systems  Constitutional: Negative for fever, appetite change, fatigue and unexpected weight change.  Eyes: Negative for pain and visual disturbance.  Respiratory: Negative for cough and shortness of breath.   Cardiovascular: Negative for cp or palpitations    Gastrointestinal: Negative for nausea, diarrhea and constipation.  Genitourinary: Negative for urgency and frequency.  Skin: Negative for pallor  or rash  MSK pos for neck discomfort/ neg for joint changes   Neurological: Negative for weakness, light-headedness, numbness and headaches.  Hematological: Negative for adenopathy. Does not bruise/bleed easily.  Psychiatric/Behavioral: Negative for dysphoric mood. The patient is not nervous/anxious.         Objective:   Physical Exam  Constitutional: She appears well-nourished. No distress.  HENT:  Head: Normocephalic and atraumatic.  Mouth/Throat: Oropharynx is clear and moist.  Eyes: Conjunctivae and EOM are normal. Pupils are equal, round, and reactive to light.  Neck: Normal range of motion. Neck supple. No tracheal deviation present. No thyromegaly present.  Tender over R para cervical musculature and slt over trapezius Nl rom of neck and shoulders  Some discomfort to tilt head to the R  No neurol changes   Cardiovascular: Normal rate and regular rhythm.   Pulmonary/Chest: Effort normal and breath sounds normal.  Musculoskeletal: She exhibits tenderness. She exhibits no edema.  Lymphadenopathy:    She has no cervical adenopathy.  Neurological: She is alert. She has normal reflexes. She displays no atrophy. No cranial nerve deficit or sensory deficit. She exhibits normal muscle tone. Coordination normal.  Skin: Skin is warm and dry. No rash noted. No erythema.  Psychiatric: She has a normal mood and affect.          Assessment & Plan:

## 2012-10-08 NOTE — Patient Instructions (Addendum)
I think you are having a muscle spasm in your neck  Get a cervical support pillow (made of "memory foam")- they come in different shapes and sizes  Use heat on and off and massage  If no further improvement - call and we will refer you to physical therapy Flu shot today

## 2012-10-09 NOTE — Assessment & Plan Note (Signed)
Musculoskeletal neck pain over several mo gradually worsening (first occurrence) -no neurol s/s Pt suspects it is pillow related -we disc getting foam cervical support pillow and finding opt height Disc stretching  Offered PT ref but she declines for now until trying the pillow change first  inst if worse or not imp to alert [place]

## 2013-02-16 ENCOUNTER — Ambulatory Visit (INDEPENDENT_AMBULATORY_CARE_PROVIDER_SITE_OTHER): Payer: 59 | Admitting: Family Medicine

## 2013-02-16 ENCOUNTER — Encounter: Payer: Self-pay | Admitting: Family Medicine

## 2013-02-16 VITALS — BP 104/66 | HR 82 | Temp 98.2°F | Ht 67.0 in | Wt 155.0 lb

## 2013-02-16 DIAGNOSIS — Z0000 Encounter for general adult medical examination without abnormal findings: Secondary | ICD-10-CM

## 2013-02-16 LAB — CBC WITH DIFFERENTIAL/PLATELET
BASOS ABS: 0 10*3/uL (ref 0.0–0.1)
Basophils Relative: 0.5 % (ref 0.0–3.0)
EOS ABS: 0.1 10*3/uL (ref 0.0–0.7)
Eosinophils Relative: 1.9 % (ref 0.0–5.0)
HCT: 33.9 % — ABNORMAL LOW (ref 36.0–46.0)
HEMOGLOBIN: 11.1 g/dL — AB (ref 12.0–15.0)
LYMPHS PCT: 30.7 % (ref 12.0–46.0)
Lymphs Abs: 1.8 10*3/uL (ref 0.7–4.0)
MCHC: 32.6 g/dL (ref 30.0–36.0)
MCV: 83.8 fl (ref 78.0–100.0)
Monocytes Absolute: 0.5 10*3/uL (ref 0.1–1.0)
Monocytes Relative: 8.4 % (ref 3.0–12.0)
NEUTROS ABS: 3.4 10*3/uL (ref 1.4–7.7)
Neutrophils Relative %: 58.5 % (ref 43.0–77.0)
PLATELETS: 198 10*3/uL (ref 150.0–400.0)
RBC: 4.04 Mil/uL (ref 3.87–5.11)
RDW: 14.3 % (ref 11.5–14.6)
WBC: 5.9 10*3/uL (ref 4.5–10.5)

## 2013-02-16 LAB — COMPREHENSIVE METABOLIC PANEL
ALT: 17 U/L (ref 0–35)
AST: 19 U/L (ref 0–37)
Albumin: 3.9 g/dL (ref 3.5–5.2)
Alkaline Phosphatase: 59 U/L (ref 39–117)
BILIRUBIN TOTAL: 0.5 mg/dL (ref 0.3–1.2)
BUN: 10 mg/dL (ref 6–23)
CALCIUM: 8.9 mg/dL (ref 8.4–10.5)
CHLORIDE: 108 meq/L (ref 96–112)
CO2: 26 mEq/L (ref 19–32)
CREATININE: 0.8 mg/dL (ref 0.4–1.2)
GFR: 96.62 mL/min (ref >60.00)
Glucose, Bld: 85 mg/dL (ref 70–99)
Potassium: 3.8 mEq/L (ref 3.5–5.1)
Sodium: 140 mEq/L (ref 135–145)
Total Protein: 7.4 g/dL (ref 6.0–8.3)

## 2013-02-16 LAB — LIPID PANEL
CHOL/HDL RATIO: 2
Cholesterol: 151 mg/dL (ref 0–200)
HDL: 74.6 mg/dL (ref >39.00)
LDL CALC: 70 mg/dL (ref 0–99)
TRIGLYCERIDES: 30 mg/dL (ref 0.0–149.0)
VLDL: 6 mg/dL (ref 0.0–40.0)

## 2013-02-16 LAB — TSH: TSH: 1.6 u[IU]/mL (ref 0.35–5.50)

## 2013-02-16 NOTE — Progress Notes (Signed)
Subjective:    Patient ID: Brooke Gill, female    DOB: 24-Sep-1970, 43 y.o.   MRN: [id number]  HPI Here for health maintenance exam and to review chronic medical problems   Wt is up 4 lb with bmi of 24   She has eaten more after the holidays  She is getting back on track  Is exercising - she does a home DVD   Feeling good overall   Mammogram 11/13- she has one scheduled this month  Self exam - no lumps   Flu vaccine 9/14  Pap 11/13- due for gyn visit later this month  Periods - regular and no problems  Not trying to get pregnant   Td 5/12   Needs lab today Hx of anemia   Patient Active Problem List   Diagnosis Date Noted  . Neck muscle spasm 10/08/2012  . Tachycardia 08/11/2010  . Routine general medical examination at a health care facility 06/19/2010  . PARESTHESIA 07/15/2009  . STRESS REACTION, ACUTE, WITH EMOTIONAL DISTURBANCE 03/25/2009  . ANEMIA, IRON DEFICIENCY 02/04/2008   Past Medical History  Diagnosis Date  . Anemia    Past Surgical History  Procedure Laterality Date  . Ovarian cyst surgery     History  Substance Use Topics  . Smoking status: Never Smoker   . Smokeless tobacco: Not on file  . Alcohol Use: No   Family History  Problem Relation Age of Onset  . Alcohol abuse Father   . Cancer Paternal Aunt     breast  . Heart disease Paternal Grandmother   . Cancer Paternal Grandfather     prostate CA   No Known Allergies Current Outpatient Prescriptions on File Prior to Visit  Medication Sig Dispense Refill  . PRENATAL VITAMINS PO Take one by mouth daily       No current facility-administered medications on file prior to visit.     Review of Systems Review of Systems  Constitutional: Negative for fever, appetite change, fatigue and unexpected weight change.  Eyes: Negative for pain and visual disturbance.  Respiratory: Negative for cough and shortness of breath.   Cardiovascular: Negative for cp or palpitations    Gastrointestinal:  Negative for nausea, diarrhea and constipation.  Genitourinary: Negative for urgency and frequency.  Skin: Negative for pallor or rash   Neurological: Negative for weakness, light-headedness, numbness and headaches.  Hematological: Negative for adenopathy. Does not bruise/bleed easily.  Psychiatric/Behavioral: Negative for dysphoric mood. The patient is not nervous/anxious.         Objective:   Physical Exam  Constitutional: She appears well-developed and well-nourished. No distress.  HENT:  Head: Normocephalic and atraumatic.  Right Ear: External ear normal.  Left Ear: External ear normal.  Nose: Nose normal.  Mouth/Throat: Oropharynx is clear and moist.  Eyes: Conjunctivae and EOM are normal. Pupils are equal, round, and reactive to light. Right eye exhibits no discharge. Left eye exhibits no discharge. No scleral icterus.  Neck: Normal range of motion. Neck supple. No JVD present. No thyromegaly present.  Cardiovascular: Normal rate, regular rhythm, normal heart sounds and intact distal pulses.  Exam reveals no gallop.   Pulmonary/Chest: Effort normal and breath sounds normal. No respiratory distress. She has no wheezes. She has no rales.  Abdominal: Soft. Bowel sounds are normal. She exhibits no distension and no mass. There is no tenderness.  Musculoskeletal: She exhibits no edema and no tenderness.  Lymphadenopathy:    She has no cervical adenopathy.  Neurological: She is  alert. She has normal reflexes. No cranial nerve deficit. She exhibits normal muscle tone. Coordination normal.  Skin: Skin is warm and dry. No rash noted. No erythema. No pallor.  Dry skin on feet   Psychiatric: She has a normal mood and affect.          Assessment & Plan:

## 2013-02-16 NOTE — Patient Instructions (Signed)
Keep up the exercise and healthy diet  Labs today

## 2013-02-16 NOTE — Progress Notes (Signed)
Pre-visit discussion using our clinic review tool. No additional management support is needed unless otherwise documented below in the visit note.  

## 2013-02-16 NOTE — Assessment & Plan Note (Signed)
Reviewed health habits including diet and exercise and skin cancer prevention Reviewed appropriate screening tests for age  Also reviewed health mt list, fam hx and immunization status , as well as social and family history   Wellness lab today Enc further good habits

## 2013-02-17 ENCOUNTER — Encounter: Payer: Self-pay | Admitting: *Deleted

## 2013-03-02 ENCOUNTER — Other Ambulatory Visit: Payer: Self-pay | Admitting: Obstetrics and Gynecology

## 2013-06-10 ENCOUNTER — Ambulatory Visit (INDEPENDENT_AMBULATORY_CARE_PROVIDER_SITE_OTHER): Payer: 59 | Admitting: Family Medicine

## 2013-06-10 ENCOUNTER — Encounter: Payer: Self-pay | Admitting: Family Medicine

## 2013-06-10 VITALS — BP 123/83 | HR 81 | Temp 98.1°F | Ht 67.0 in | Wt 157.0 lb

## 2013-06-10 DIAGNOSIS — M25579 Pain in unspecified ankle and joints of unspecified foot: Secondary | ICD-10-CM | POA: Insufficient documentation

## 2013-06-10 MED ORDER — NAPROXEN 500 MG PO TABS: 500.0000 mg | ORAL_TABLET | Freq: Two times a day (BID) | ORAL | Status: AC

## 2013-06-10 NOTE — Progress Notes (Signed)
   Subjective:    Patient ID: Brooke Gill, female    DOB: 08-11-1970, 43 y.o.   MRN: [id number]  HPI Here with swollen ankles for about 3 weeks   Correlates with warmer weather Also did boot camp exercise program - she is holding off   Swelling is in outer ankle both sides  Not in calf  ? If pitting  Hurts - ankle pain - and tenderness - even the sheets bother her  A little red/discolored without rash   She did run a mile in wrong shoes  Also jumping rope - for a while    No hx of gout  gmother had it   Is drinking water - ? If enough - 20-24 oz per day  More when she exercises  Diet has not changed at all    Patient Active Problem List   Diagnosis Date Noted  . Routine general medical examination at a health care facility 06/19/2010  . STRESS REACTION, ACUTE, WITH EMOTIONAL DISTURBANCE 03/25/2009  . ANEMIA, IRON DEFICIENCY 02/04/2008   Past Medical History  Diagnosis Date  . Anemia    Past Surgical History  Procedure Laterality Date  . Ovarian cyst surgery     History  Substance Use Topics  . Smoking status: Never Smoker   . Smokeless tobacco: Never Used  . Alcohol Use: No   Family History  Problem Relation Age of Onset  . Alcohol abuse Father   . Cancer Paternal Aunt     breast  . Heart disease Paternal Grandmother   . Cancer Paternal Grandfather     prostate CA   No Known Allergies Current Outpatient Prescriptions on File Prior to Visit  Medication Sig Dispense Refill  . PRENATAL VITAMINS PO Take one by mouth daily       No current facility-administered medications on file prior to visit.     Review of Systems Review of Systems  Constitutional: Negative for fever, appetite change, fatigue and unexpected weight change.  Eyes: Negative for pain and visual disturbance.  Respiratory: Negative for cough and shortness of breath.   Cardiovascular: Negative for cp or palpitations    Gastrointestinal: Negative for nausea, diarrhea and constipation.    Genitourinary: Negative for urgency and frequency.  Skin: Negative for pallor or rash   MSK pos for bilat ankle pain and swelling  Neurological: Negative for weakness, light-headedness, numbness and headaches.  Hematological: Negative for adenopathy. Does not bruise/bleed easily.  Psychiatric/Behavioral: Negative for dysphoric mood. The patient is not nervous/anxious.         Objective:   Physical Exam  Constitutional: She appears well-developed and well-nourished. No distress.  HENT:  Head: Normocephalic and atraumatic.  Eyes: Conjunctivae and EOM are normal. Pupils are equal, round, and reactive to light.  Cardiovascular: Normal rate, regular rhythm and intact distal pulses.   Pulmonary/Chest: Effort normal and breath sounds normal.  Musculoskeletal: She exhibits edema and tenderness.  Bilateral ankles have nonpitting edema/focal swelling over lateral malleoli only with tenderness on and post to each malleolus Nl rom both ankles No ecchymosis  Scant rubor over R lateral malleolus No crepitus No instability  No bony tenderness of either foot   Neurological: She is alert.  Skin: Skin is warm and dry. No rash noted.  Psychiatric: She has a normal mood and affect.          Assessment & Plan:

## 2013-06-10 NOTE — Progress Notes (Signed)
Pre visit review using our clinic review tool, if applicable. No additional management support is needed unless otherwise documented below in the visit note. 

## 2013-06-10 NOTE — Patient Instructions (Signed)
I think you have some overuse strain/ injury in your ankles  Elevate feet every chance you get  Ice or cold compress 10 minutes on/ 10 minutes off whenever you can  Relative rest (no heavy duty exercise but routine walking is fine with a supportive shoe)  If worse or not improved in 2 weeks call and let us know

## 2013-06-11 NOTE — Assessment & Plan Note (Signed)
Bilateral ankle pain -suspect from overuse injury after doing a boot camp exercise program  Adv ice /elevation when able and use of supportive shoes/ with relative rest from high impact exercise and running  nsaid -naproxen 500 bid with food for 2 wk If worse or not imp in 1-2 wk consider films and further eval

## 2013-10-09 ENCOUNTER — Telehealth: Payer: Self-pay | Admitting: Family Medicine

## 2013-10-09 DIAGNOSIS — Z0279 Encounter for issue of other medical certificate: Secondary | ICD-10-CM

## 2013-10-09 NOTE — Telephone Encounter (Signed)
Pt dropped off Health Screening form to be filled out. Needs to be send directly to [facility]. Put in inbox.

## 2013-10-09 NOTE — Telephone Encounter (Signed)
Left voicemail letting pt know we need her to measure her waist and call us with that #

## 2013-10-09 NOTE — Telephone Encounter (Signed)
Pt came by office, I got waist con. And faxed form

## 2013-10-09 NOTE — Telephone Encounter (Signed)
In IN box She will have to give you a waist circumference

## 2013-12-22 ENCOUNTER — Ambulatory Visit (INDEPENDENT_AMBULATORY_CARE_PROVIDER_SITE_OTHER): Payer: 59 | Admitting: Family Medicine

## 2013-12-22 ENCOUNTER — Encounter: Payer: Self-pay | Admitting: Family Medicine

## 2013-12-22 VITALS — BP 104/68 | HR 78 | Temp 98.7°F | Ht 67.0 in | Wt 154.0 lb

## 2013-12-22 DIAGNOSIS — M79621 Pain in right upper arm: Secondary | ICD-10-CM

## 2013-12-22 DIAGNOSIS — M79601 Pain in right arm: Secondary | ICD-10-CM

## 2013-12-22 NOTE — Progress Notes (Signed)
Subjective:    Patient ID: Brooke Gill, female    DOB: 06-Sep-1970, 43 y.o.   MRN: [id number]  HPI Here with pain under R arm in axilla Started about 3 weeks ago  Not severe  Hurts worst to move arm - any direction  No better and no worse  Has not noticed swelling or lumps   Last mammogram was about a year ago and she has one scheduled  [facility] Ouida S[facility] gyn- does it in the office  She has that appt in Dec   No n/t of arm   No injury known  No new exercise programs or sports   Patient Active Problem List   Diagnosis Date Noted  . Ankle pain 06/10/2013  . Routine general medical examination at a health care facility 06/19/2010  . STRESS REACTION, ACUTE, WITH EMOTIONAL DISTURBANCE 03/25/2009  . ANEMIA, IRON DEFICIENCY 02/04/2008   Past Medical History  Diagnosis Date  . Anemia    Past Surgical History  Procedure Laterality Date  . Ovarian cyst surgery     History  Substance Use Topics  . Smoking status: Never Smoker   . Smokeless tobacco: Never Used  . Alcohol Use: No   Family History  Problem Relation Age of Onset  . Alcohol abuse Father   . Cancer Paternal Aunt     breast  . Heart disease Paternal Grandmother   . Cancer Paternal Grandfather     prostate CA   No Known Allergies Current Outpatient Prescriptions on File Prior to Visit  Medication Sig Dispense Refill  . naproxen (NAPROSYN) 500 MG tablet Take 1 tablet (500 mg total) by mouth 2 (two) times daily with a meal. 60 tablet 0  . PRENATAL VITAMINS PO Take one by mouth daily     No current facility-administered medications on file prior to visit.     Review of Systems    Review of Systems  Constitutional: Negative for fever, appetite change, fatigue and unexpected weight change.  Eyes: Negative for pain and visual disturbance.  Respiratory: Negative for cough and shortness of breath.   Cardiovascular: Negative for cp or palpitations    Gastrointestinal: Negative for nausea, diarrhea  and constipation.  Genitourinary: Negative for urgency and frequency.  Skin: Negative for pallor or rash   Neurological: Negative for weakness, light-headedness, numbness and headaches.  Hematological: Negative for adenopathy. Does not bruise/bleed easily.  Psychiatric/Behavioral: Negative for dysphoric mood. The patient is not nervous/anxious.      Objective:   Physical Exam  Constitutional: She appears well-developed and well-nourished. No distress.  HENT:  Head: Normocephalic and atraumatic.  Mouth/Throat: Oropharynx is clear and moist.  Eyes: Conjunctivae and EOM are normal. Pupils are equal, round, and reactive to light. No scleral icterus.  Neck: Normal range of motion. Neck supple. No JVD present. No thyromegaly present.  Cardiovascular: Normal rate, regular rhythm, normal heart sounds and intact distal pulses.   Pulmonary/Chest: Effort normal and breath sounds normal. No respiratory distress. She has no wheezes. She has no rales.  Abdominal: Soft. Bowel sounds are normal.  Genitourinary:  Breast exam: No mass, nodules, thickening, bulging, retraction, inflamation, nipple discharge or skin changes noted.  No axillary or clavicular LA.     Pt does have some mild tenderness in R axilla / lat upper R breast on palpation     Musculoskeletal: She exhibits no edema.  No spinal tenderness - cervical or thoracic   Lymphadenopathy:    She has  no cervical adenopathy.  Neurological: She is alert. She has normal reflexes.  Skin: Skin is warm and dry. No rash noted. No erythema. No pallor.  Psychiatric: She has a normal mood and affect.          Assessment & Plan:   Problem List Items Addressed This Visit      Other   Pain in right axilla - Primary    Normal exam and ROM of arm and torso  Nl breast exam as well  slt tenderness over lateral R upper breast and axilla I would recommend dx mammogram and [place] of that breast  Pt has upcoming gyn visit and mammogram - will get those  done there  In the meantime recommend warm compress prn and update if pain worsens or other changes (like rash)

## 2013-12-22 NOTE — Assessment & Plan Note (Signed)
Normal exam and ROM of arm and torso  Nl breast exam as well  slt tenderness over lateral R upper breast and axilla I would recommend dx mammogram and [place] of that breast  Pt has upcoming gyn visit and mammogram - will get those done there  In the meantime recommend warm compress prn and update if pain worsens or other changes (like rash)

## 2013-12-22 NOTE — Progress Notes (Signed)
Pre visit review using our clinic review tool, if applicable. No additional management support is needed unless otherwise documented below in the visit note. 

## 2013-12-22 NOTE — Patient Instructions (Signed)
I think your pain is muscular  Use a warm compress whenever you can for 10 minutes  I would ordinarily order a mammogram and ultrasound of that side - since you do these at the gyn office - tell them beforehand that you have had pain in R armpit area Exam is normal  If pain worsens or changes/ or find a lump- please alert me

## 2014-02-19 ENCOUNTER — Encounter: Payer: 59 | Admitting: Family Medicine

## 2014-02-19 ENCOUNTER — Telehealth: Payer: Self-pay | Admitting: Family Medicine

## 2014-02-19 NOTE — Telephone Encounter (Signed)
Patient called and said she forgot about her appointment even though she received several notices and she felt really bad.  She said she'd pay the no show fee if you have to charge her.  She rescheduled her appointment to your next available on 07/21/14.

## 2014-02-19 NOTE — Telephone Encounter (Signed)
Patient did not come for their scheduled appointment today for cpx  Please let me know if the patient needs to be contacted immediately for follow up or if no follow up is necessary.   ° °

## 2014-02-19 NOTE — Telephone Encounter (Signed)
It was for her physical I think - so have her re schedule if she wnts to

## 2014-04-19 ENCOUNTER — Other Ambulatory Visit: Payer: Self-pay | Admitting: Obstetrics and Gynecology

## 2014-04-20 LAB — CYTOLOGY - PAP

## 2014-06-07 LAB — HM MAMMOGRAPHY: HM MAMMO: NORMAL

## 2014-07-13 ENCOUNTER — Telehealth: Payer: Self-pay | Admitting: Family Medicine

## 2014-07-13 DIAGNOSIS — Z0000 Encounter for general adult medical examination without abnormal findings: Secondary | ICD-10-CM

## 2014-07-13 NOTE — Telephone Encounter (Signed)
-----   Message from Ellamae Sia sent at 07/07/2014  5:39 PM EDT ----- Regarding: Lab orders for Wednesday, 6.8.16 Patient is scheduled for CPX labs, please order future labs, Thanks , Karna Christmas

## 2014-07-14 ENCOUNTER — Other Ambulatory Visit (INDEPENDENT_AMBULATORY_CARE_PROVIDER_SITE_OTHER): Payer: 59

## 2014-07-14 DIAGNOSIS — Z0000 Encounter for general adult medical examination without abnormal findings: Secondary | ICD-10-CM

## 2014-07-14 LAB — CBC WITH DIFFERENTIAL/PLATELET
Basophils Absolute: 0 10*3/uL (ref 0.0–0.1)
Basophils Relative: 0.5 % (ref 0.0–3.0)
Eosinophils Absolute: 0.2 10*3/uL (ref 0.0–0.7)
Eosinophils Relative: 2.6 % (ref 0.0–5.0)
HEMATOCRIT: 33.9 % — AB (ref 36.0–46.0)
Hemoglobin: 10.9 g/dL — ABNORMAL LOW (ref 12.0–15.0)
Lymphocytes Relative: 29.9 % (ref 12.0–46.0)
Lymphs Abs: 2 10*3/uL (ref 0.7–4.0)
MCHC: 32.1 g/dL (ref 30.0–36.0)
MCV: 84.5 fl (ref 78.0–100.0)
Monocytes Absolute: 0.5 10*3/uL (ref 0.1–1.0)
Monocytes Relative: 7 % (ref 3.0–12.0)
Neutro Abs: 4.1 10*3/uL (ref 1.4–7.7)
Neutrophils Relative %: 60 % (ref 43.0–77.0)
Platelets: 211 10*3/uL (ref 150.0–400.0)
RBC: 4.01 Mil/uL (ref 3.87–5.11)
RDW: 14.6 % (ref 11.5–15.5)
WBC: 6.8 10*3/uL (ref 4.0–10.5)

## 2014-07-14 LAB — COMPREHENSIVE METABOLIC PANEL
ALT: 10 U/L (ref 0–35)
AST: 13 U/L (ref 0–37)
Albumin: 4 g/dL (ref 3.5–5.2)
Alkaline Phosphatase: 71 U/L (ref 39–117)
BUN: 9 mg/dL (ref 6–23)
CO2: 27 mEq/L (ref 19–32)
CREATININE: 0.84 mg/dL (ref 0.40–1.20)
Calcium: 9 mg/dL (ref 8.4–10.5)
Chloride: 105 mEq/L (ref 96–112)
GFR: 94.67 mL/min (ref >60.00)
Glucose, Bld: 93 mg/dL (ref 70–99)
Potassium: 3.8 mEq/L (ref 3.5–5.1)
SODIUM: 137 meq/L (ref 135–145)
Total Bilirubin: 0.4 mg/dL (ref 0.2–1.2)
Total Protein: 7.2 g/dL (ref 6.0–8.3)

## 2014-07-14 LAB — LIPID PANEL
CHOL/HDL RATIO: 2
Cholesterol: 136 mg/dL (ref 0–200)
HDL: 63 mg/dL (ref >39.00)
LDL CALC: 63 mg/dL (ref 0–99)
NonHDL: 73
TRIGLYCERIDES: 48 mg/dL (ref 0.0–149.0)
VLDL: 9.6 mg/dL (ref 0.0–40.0)

## 2014-07-14 LAB — TSH: TSH: 1.33 u[IU]/mL (ref 0.35–4.50)

## 2014-07-19 ENCOUNTER — Telehealth: Payer: Self-pay | Admitting: Family Medicine

## 2014-07-19 NOTE — Telephone Encounter (Signed)
Please put her in earlier wherever you can fit  Thanks

## 2014-07-19 NOTE — Telephone Encounter (Signed)
Patient has an appointment for a cpx on 07/21/14,  Patient had her lab work done last week.  Patient's out of town and can't make it to her cpx appointment on 07/21/14.  Dr.Tower's next available cpx is in January.  Do you want patient to wait until January or see her sooner or have her see another provider for her cpx?

## 2014-07-21 ENCOUNTER — Encounter: Payer: Self-pay | Admitting: Family Medicine

## 2014-08-17 ENCOUNTER — Encounter: Payer: Self-pay | Admitting: Family Medicine

## 2014-08-17 ENCOUNTER — Ambulatory Visit (INDEPENDENT_AMBULATORY_CARE_PROVIDER_SITE_OTHER): Payer: 59 | Admitting: Family Medicine

## 2014-08-17 VITALS — BP 110/68 | HR 70 | Temp 98.3°F | Ht 67.0 in | Wt 157.5 lb

## 2014-08-17 DIAGNOSIS — D509 Iron deficiency anemia, unspecified: Secondary | ICD-10-CM

## 2014-08-17 DIAGNOSIS — Z0000 Encounter for general adult medical examination without abnormal findings: Secondary | ICD-10-CM

## 2014-08-17 MED ORDER — POLYSACCHARIDE IRON COMPLEX 150 MG PO CAPS: 150.0000 mg | ORAL_CAPSULE | Freq: Every day | ORAL | Status: DC

## 2014-08-17 NOTE — Progress Notes (Signed)
Pre visit review using our clinic review tool, if applicable. No additional management support is needed unless otherwise documented below in the visit note. 

## 2014-08-17 NOTE — Assessment & Plan Note (Signed)
Ongoing -suspect due to menses Hb is 10.9  Fatigued Px nu iron 150 to take daily as directed - disc poss side eff  Rev diet and iron rich foods  Will continue to follow

## 2014-08-17 NOTE — Assessment & Plan Note (Signed)
Reviewed health habits including diet and exercise and skin cancer prevention Reviewed appropriate screening tests for age  Also reviewed health mt list, fam hx and immunization status , as well as social and family history   See HPI Labs rev  Enc more varied diet and also exercise plan for bone and overall health utd gyn care

## 2014-08-17 NOTE — Patient Instructions (Signed)
Try the iron supplment (Nu iron) 150 mg daily for anemia  Drink lots of fluids  Think about exercise  Consider smoothies to get vegetables  Take care of yourself

## 2014-08-17 NOTE — Progress Notes (Signed)
Subjective:    Patient ID: Brooke Gill, female    DOB: 12-28-1970, 44 y.o.   MRN: [id number]  HPI Here for health maintenance exam and to review chronic medical problems    Go to go to [facility] - she had a good trip   Has been feeling ok  Some problems with fatigue    Wt is up 3 lb  Good bmi at 24 Not really eating healthy - some junk food (she does not like vegetables)- she does eat some fruit  No exercise -but plans to start    HIV screen-has had it done and it was neg - 8 years ago / also low risk   Mammogram just had it in May 2016 - normal  Self breast exam- no lumps   Pap 3/16 with gyn Dr Ouida Sills -nl  Periods are still regular  Same as always - heavy , last at least 6 days / not extremely heavy but some clots   Anemia  Lab Results  Component Value Date   WBC 6.8 07/14/2014   HGB 10.9* 07/14/2014   HCT 33.9* 07/14/2014   MCV 84.5 07/14/2014   PLT 211.0 07/14/2014   Hb is down from 11.1  Why she is tired  Taking PNV with iron    No hx of sickle cell trait  Has "always been anemic"  Does not donate blood    Results for orders placed or performed in visit on 07/14/14  Comprehensive metabolic panel  Result Value Ref Range   Sodium 137 135 - 145 mEq/L   Potassium 3.8 3.5 - 5.1 mEq/L   Chloride 105 96 - 112 mEq/L   CO2 27 19 - 32 mEq/L   Glucose, Bld 93 70 - 99 mg/dL   BUN 9 6 - 23 mg/dL   Creatinine, Ser 0.84 0.40 - 1.20 mg/dL   Total Bilirubin 0.4 0.2 - 1.2 mg/dL   Alkaline Phosphatase 71 39 - 117 U/L   AST 13 0 - 37 U/L   ALT 10 0 - 35 U/L   Total Protein 7.2 6.0 - 8.3 g/dL   Albumin 4.0 3.5 - 5.2 g/dL   Calcium 9.0 8.4 - 10.5 mg/dL   GFR 94.67 >60.00 mL/min  CBC with Differential/Platelet  Result Value Ref Range   WBC 6.8 4.0 - 10.5 K/uL   RBC 4.01 3.87 - 5.11 Mil/uL   Hemoglobin 10.9 (L) 12.0 - 15.0 g/dL   HCT 33.9 (L) 36.0 - 46.0 %   MCV 84.5 78.0 - 100.0 fl   MCHC 32.1 30.0 - 36.0 g/dL   RDW 14.6 11.5 - 15.5 %   Platelets 211.0 150.0 -  400.0 K/uL   Neutrophils Relative % 60.0 43.0 - 77.0 %   Lymphocytes Relative 29.9 12.0 - 46.0 %   Monocytes Relative 7.0 3.0 - 12.0 %   Eosinophils Relative 2.6 0.0 - 5.0 %   Basophils Relative 0.5 0.0 - 3.0 %   Neutro Abs 4.1 1.4 - 7.7 K/uL   Lymphs Abs 2.0 0.7 - 4.0 K/uL   Monocytes Absolute 0.5 0.1 - 1.0 K/uL   Eosinophils Absolute 0.2 0.0 - 0.7 K/uL   Basophils Absolute 0.0 0.0 - 0.1 K/uL  Lipid panel  Result Value Ref Range   Cholesterol 136 0 - 200 mg/dL   Triglycerides 48.0 0.0 - 149.0 mg/dL   HDL 63.00 >39.00 mg/dL   VLDL 9.6 0.0 - 40.0 mg/dL   LDL Cholesterol 63 0 - 99 mg/dL  Total CHOL/HDL Ratio 2    NonHDL 73.00   TSH  Result Value Ref Range   TSH 1.33 0.35 - 4.50 uIU/mL    Despite sub optimal eating - cholesterol is excellent    Patient Active Problem List   Diagnosis Date Noted  . Pain in right axilla 12/22/2013  . Ankle pain 06/10/2013  . Routine general medical examination at a health care facility 06/19/2010  . STRESS REACTION, ACUTE, WITH EMOTIONAL DISTURBANCE 03/25/2009  . Iron deficiency anemia 02/04/2008   Past Medical History  Diagnosis Date  . Anemia    Past Surgical History  Procedure Laterality Date  . Ovarian cyst surgery     History  Substance Use Topics  . Smoking status: Never Smoker   . Smokeless tobacco: Never Used  . Alcohol Use: No   Family History  Problem Relation Age of Onset  . Alcohol abuse Father   . Cancer Paternal Aunt     breast  . Heart disease Paternal Grandmother   . Cancer Paternal Grandfather     prostate CA   No Known Allergies Current Outpatient Prescriptions on File Prior to Visit  Medication Sig Dispense Refill  . naproxen (NAPROSYN) 500 MG tablet Take 1 tablet (500 mg total) by mouth 2 (two) times daily with a meal. 60 tablet 0  . PRENATAL VITAMINS PO Take one by mouth daily     No current facility-administered medications on file prior to visit.     Review of Systems Review of Systems    Constitutional: Negative for fever, appetite change,  and unexpected weight change.  Eyes: Negative for pain and visual disturbance.  Respiratory: Negative for cough and shortness of breath.   Cardiovascular: Negative for cp or palpitations    Gastrointestinal: Negative for nausea, diarrhea and constipation.  Genitourinary: Negative for urgency and frequency.  Skin: Negative for pallor or rash   Neurological: Negative for weakness, light-headedness, numbness and headaches.  Hematological: Negative for adenopathy. Does not bruise/bleed easily.  Psychiatric/Behavioral: Negative for dysphoric mood. The patient is not nervous/anxious.         Objective:   Physical Exam  Constitutional: She appears well-developed and well-nourished. No distress.  HENT:  Head: Normocephalic and atraumatic.  Right Ear: External ear normal.  Left Ear: External ear normal.  Mouth/Throat: Oropharynx is clear and moist.  Eyes: Conjunctivae and EOM are normal. Pupils are equal, round, and reactive to light. No scleral icterus.  Neck: Normal range of motion. Neck supple. No JVD present. Carotid bruit is not present. No thyromegaly present.  Cardiovascular: Normal rate, regular rhythm, normal heart sounds and intact distal pulses.  Exam reveals no gallop.   Pulmonary/Chest: Effort normal and breath sounds normal. No respiratory distress. She has no wheezes. She exhibits no tenderness.  Abdominal: Soft. Bowel sounds are normal. She exhibits no distension, no abdominal bruit and no mass. There is no tenderness.  Genitourinary: No breast swelling, tenderness, discharge or bleeding.  Breast exam: No mass, nodules, thickening, tenderness, bulging, retraction, inflamation, nipple discharge or skin changes noted.  No axillary or clavicular LA.      Musculoskeletal: Normal range of motion. She exhibits no edema or tenderness.  Lymphadenopathy:    She has no cervical adenopathy.  Neurological: She is alert. She has normal  reflexes. No cranial nerve deficit. She exhibits normal muscle tone. Coordination normal.  Skin: Skin is warm and dry. No rash noted. No erythema. No pallor.  No pallor   Psychiatric: She has a normal  mood and affect.          Assessment & Plan:   Problem List Items Addressed This Visit    Iron deficiency anemia    Ongoing -suspect due to menses Hb is 10.9  Fatigued Px nu iron 150 to take daily as directed - disc poss side eff  Rev diet and iron rich foods  Will continue to follow        Relevant Medications   iron polysaccharides (NU-IRON) 150 MG capsule   Routine general medical examination at a health care facility - Primary    Reviewed health habits including diet and exercise and skin cancer prevention Reviewed appropriate screening tests for age  Also reviewed health mt list, fam hx and immunization status , as well as social and family history   See HPI Labs rev  Enc more varied diet and also exercise plan for bone and overall health utd gyn care

## 2014-10-21 ENCOUNTER — Telehealth: Payer: Self-pay | Admitting: Family Medicine

## 2014-10-21 NOTE — Telephone Encounter (Signed)
Form in your inbox 

## 2014-10-21 NOTE — Telephone Encounter (Signed)
Pt dropped off Biometric Measures & Physical Confirmation form for completion.  Best number to call when complete is [phone], form placed on SW desk / lt

## 2014-10-22 DIAGNOSIS — Z0279 Encounter for issue of other medical certificate: Secondary | ICD-10-CM

## 2014-10-22 NOTE — Telephone Encounter (Signed)
This needs an abd circumference-please ask her   In IN box

## 2014-10-22 NOTE — Telephone Encounter (Signed)
Left voicemail requesting pt to call office back 

## 2014-10-25 NOTE — Telephone Encounter (Signed)
Left 2nd voicemail requesting pt to call office back 

## 2014-10-27 NOTE — Telephone Encounter (Signed)
Pt returned call, please call back at [phone]

## 2014-10-27 NOTE — Telephone Encounter (Signed)
Left voicemail requesting pt to call office back 

## 2014-10-29 NOTE — Telephone Encounter (Signed)
Left voicemail requesting pt to call office back 

## 2014-11-01 NOTE — Telephone Encounter (Signed)
Pt came to the office, I got her abd circ. And gave her the paper and advised her of the fee

## 2015-05-25 ENCOUNTER — Other Ambulatory Visit: Payer: Self-pay | Admitting: Obstetrics and Gynecology

## 2015-05-26 LAB — CYTOLOGY - PAP

## 2015-05-31 ENCOUNTER — Other Ambulatory Visit: Payer: Self-pay | Admitting: Obstetrics and Gynecology

## 2015-05-31 DIAGNOSIS — R928 Other abnormal and inconclusive findings on diagnostic imaging of breast: Secondary | ICD-10-CM

## 2015-06-03 ENCOUNTER — Ambulatory Visit
Admission: RE | Admit: 2015-06-03 | Discharge: 2015-06-03 | Disposition: A | Discharge status: Home / Self Care | Payer: 59 | Source: Ambulatory Visit | Attending: Obstetrics and Gynecology | Admitting: Obstetrics and Gynecology

## 2015-06-03 ENCOUNTER — Other Ambulatory Visit: Payer: Self-pay | Admitting: Obstetrics and Gynecology

## 2015-06-03 DIAGNOSIS — R928 Other abnormal and inconclusive findings on diagnostic imaging of breast: Secondary | ICD-10-CM

## 2015-06-14 ENCOUNTER — Other Ambulatory Visit: Payer: Self-pay | Admitting: Obstetrics and Gynecology

## 2015-06-14 DIAGNOSIS — R928 Other abnormal and inconclusive findings on diagnostic imaging of breast: Secondary | ICD-10-CM

## 2015-06-15 ENCOUNTER — Ambulatory Visit
Admission: RE | Admit: 2015-06-15 | Discharge: 2015-06-15 | Disposition: A | Discharge status: Home / Self Care | Payer: 59 | Source: Ambulatory Visit | Attending: Obstetrics and Gynecology | Admitting: Obstetrics and Gynecology

## 2015-06-15 DIAGNOSIS — R928 Other abnormal and inconclusive findings on diagnostic imaging of breast: Secondary | ICD-10-CM

## 2015-08-10 ENCOUNTER — Telehealth: Payer: Self-pay | Admitting: Family Medicine

## 2015-08-10 DIAGNOSIS — D509 Iron deficiency anemia, unspecified: Secondary | ICD-10-CM

## 2015-08-10 DIAGNOSIS — Z0000 Encounter for general adult medical examination without abnormal findings: Secondary | ICD-10-CM

## 2015-08-10 NOTE — Telephone Encounter (Signed)
-----   Message from Marchia Bond sent at 08/10/2015 10:51 AM EDT ----- Regarding: Cpx labs Mon 7/10, need orders. Thanks! :-) Please order  future cpx labs for pt's upcoming lab appt. Thanks Aniceto Boss

## 2015-08-15 ENCOUNTER — Other Ambulatory Visit (INDEPENDENT_AMBULATORY_CARE_PROVIDER_SITE_OTHER): Payer: 59

## 2015-08-15 DIAGNOSIS — Z0000 Encounter for general adult medical examination without abnormal findings: Secondary | ICD-10-CM

## 2015-08-15 DIAGNOSIS — D509 Iron deficiency anemia, unspecified: Secondary | ICD-10-CM

## 2015-08-15 LAB — LIPID PANEL
Cholesterol: 139 mg/dL (ref 0–200)
HDL: 62.5 mg/dL (ref >39.00)
LDL CALC: 66 mg/dL (ref 0–99)
NONHDL: 76.22
Total CHOL/HDL Ratio: 2
Triglycerides: 50 mg/dL (ref 0.0–149.0)
VLDL: 10 mg/dL (ref 0.0–40.0)

## 2015-08-15 LAB — COMPREHENSIVE METABOLIC PANEL
ALK PHOS: 72 U/L (ref 39–117)
ALT: 15 U/L (ref 0–35)
AST: 18 U/L (ref 0–37)
Albumin: 4.1 g/dL (ref 3.5–5.2)
BUN: 9 mg/dL (ref 6–23)
CHLORIDE: 108 meq/L (ref 96–112)
CO2: 23 meq/L (ref 19–32)
Calcium: 9 mg/dL (ref 8.4–10.5)
Creatinine, Ser: 1.15 mg/dL (ref 0.40–1.20)
GFR: 65.56 mL/min (ref >60.00)
GLUCOSE: 93 mg/dL (ref 70–99)
POTASSIUM: 3.6 meq/L (ref 3.5–5.1)
SODIUM: 138 meq/L (ref 135–145)
TOTAL PROTEIN: 7.4 g/dL (ref 6.0–8.3)
Total Bilirubin: 0.5 mg/dL (ref 0.2–1.2)

## 2015-08-15 LAB — CBC WITH DIFFERENTIAL/PLATELET
BASOS PCT: 0.8 % (ref 0.0–3.0)
Basophils Absolute: 0 10*3/uL (ref 0.0–0.1)
EOS PCT: 3.2 % (ref 0.0–5.0)
Eosinophils Absolute: 0.2 10*3/uL (ref 0.0–0.7)
HCT: 34 % — ABNORMAL LOW (ref 36.0–46.0)
Hemoglobin: 11.2 g/dL — ABNORMAL LOW (ref 12.0–15.0)
LYMPHS ABS: 2 10*3/uL (ref 0.7–4.0)
LYMPHS PCT: 38.3 % (ref 12.0–46.0)
MCHC: 32.9 g/dL (ref 30.0–36.0)
MCV: 83.9 fl (ref 78.0–100.0)
MONOS PCT: 8.6 % (ref 3.0–12.0)
Monocytes Absolute: 0.4 10*3/uL (ref 0.1–1.0)
NEUTROS ABS: 2.5 10*3/uL (ref 1.4–7.7)
NEUTROS PCT: 49.1 % (ref 43.0–77.0)
PLATELETS: 211 10*3/uL (ref 150.0–400.0)
RBC: 4.05 Mil/uL (ref 3.87–5.11)
RDW: 14.6 % (ref 11.5–15.5)
WBC: 5.1 10*3/uL (ref 4.0–10.5)

## 2015-08-15 LAB — TSH: TSH: 1.75 u[IU]/mL (ref 0.35–4.50)

## 2015-08-15 LAB — FERRITIN: Ferritin: 69.7 ng/mL (ref 10.0–291.0)

## 2015-08-22 ENCOUNTER — Encounter: Payer: Self-pay | Admitting: Family Medicine

## 2015-08-22 ENCOUNTER — Ambulatory Visit (INDEPENDENT_AMBULATORY_CARE_PROVIDER_SITE_OTHER): Payer: 59 | Admitting: Family Medicine

## 2015-08-22 VITALS — BP 100/62 | HR 74 | Temp 98.5°F | Ht 67.0 in | Wt 158.8 lb

## 2015-08-22 DIAGNOSIS — Z0000 Encounter for general adult medical examination without abnormal findings: Secondary | ICD-10-CM

## 2015-08-22 DIAGNOSIS — D509 Iron deficiency anemia, unspecified: Secondary | ICD-10-CM

## 2015-08-22 NOTE — Progress Notes (Signed)
Subjective:    Patient ID: Brooke Gill, female    DOB: 1970-04-21, 45 y.o.   MRN: [id number]  HPI Here for health maintenance exam and to review chronic medical problems   Doing well overall  Working -regular schedule  Few trips for work     [employer] from Last 3 Encounters:  08/22/15 158 lb 12 oz (72.009 kg)  08/17/14 157 lb 8 oz (71.442 kg)  12/22/13 154 lb (69.854 kg)   bmi is 24.8  Mammogram 5/17 Had to have L breast core bx- was neg /B9 rec screening in a year Self exam - no lumps or changes   Pap 4/17- neg at [facility] gyn No plans to get pregnant Does not need birth control  Periods are heavy/baseline-but deals with it (not painful)   Td 5/12  Got a flu shot in the fall   Hx of iron def anemia  On iron  Lab Results  Component Value Date   WBC 5.1 08/15/2015   HGB 11.2* 08/15/2015   HCT 34.0* 08/15/2015   MCV 83.9 08/15/2015   PLT 211.0 08/15/2015   This is imp from last Hb 10.9 before starting iron  Nu iron -every other day Also PND with iron daily  Energy level is good  Not exercising - wants to start back to the gym Eating somewhat healthy   HIV done 7/08 and neg   Her posture is poor - thinks this is due to work position  Thinks she has a fatty lump on her back ? Unsure?  Thinks she needs back support in her chair   Keeps up to date on eye exams- uses readers now    Results for orders placed or performed in visit on 08/15/15  CBC with Differential/Platelet  Result Value Ref Range   WBC 5.1 4.0 - 10.5 K/uL   RBC 4.05 3.87 - 5.11 Mil/uL   Hemoglobin 11.2 (L) 12.0 - 15.0 g/dL   HCT 34.0 (L) 36.0 - 46.0 %   MCV 83.9 78.0 - 100.0 fl   MCHC 32.9 30.0 - 36.0 g/dL   RDW 14.6 11.5 - 15.5 %   Platelets 211.0 150.0 - 400.0 K/uL   Neutrophils Relative % 49.1 43.0 - 77.0 %   Lymphocytes Relative 38.3 12.0 - 46.0 %   Monocytes Relative 8.6 3.0 - 12.0 %   Eosinophils Relative 3.2 0.0 - 5.0 %   Basophils Relative 0.8 0.0 - 3.0 %   Neutro  Abs 2.5 1.4 - 7.7 K/uL   Lymphs Abs 2.0 0.7 - 4.0 K/uL   Monocytes Absolute 0.4 0.1 - 1.0 K/uL   Eosinophils Absolute 0.2 0.0 - 0.7 K/uL   Basophils Absolute 0.0 0.0 - 0.1 K/uL  Comprehensive metabolic panel  Result Value Ref Range   Sodium 138 135 - 145 mEq/L   Potassium 3.6 3.5 - 5.1 mEq/L   Chloride 108 96 - 112 mEq/L   CO2 23 19 - 32 mEq/L   Glucose, Bld 93 70 - 99 mg/dL   BUN 9 6 - 23 mg/dL   Creatinine, Ser 1.15 0.40 - 1.20 mg/dL   Total Bilirubin 0.5 0.2 - 1.2 mg/dL   Alkaline Phosphatase 72 39 - 117 U/L   AST 18 0 - 37 U/L   ALT 15 0 - 35 U/L   Total Protein 7.4 6.0 - 8.3 g/dL   Albumin 4.1 3.5 - 5.2 g/dL   Calcium 9.0 8.4 - 10.5 mg/dL   GFR 65.56 >60.00  mL/min  Lipid panel  Result Value Ref Range   Cholesterol 139 0 - 200 mg/dL   Triglycerides 50.0 0.0 - 149.0 mg/dL   HDL 62.50 >39.00 mg/dL   VLDL 10.0 0.0 - 40.0 mg/dL   LDL Cholesterol 66 0 - 99 mg/dL   Total CHOL/HDL Ratio 2    NonHDL 76.22   TSH  Result Value Ref Range   TSH 1.75 0.35 - 4.50 uIU/mL  Ferritin  Result Value Ref Range   Ferritin 69.7 10.0 - 291.0 ng/mL     Patient Active Problem List   Diagnosis Date Noted  . Routine general medical examination at a health care facility 06/19/2010  . Iron deficiency anemia 02/04/2008   Past Medical History  Diagnosis Date  . Anemia    Past Surgical History  Procedure Laterality Date  . Ovarian cyst surgery     Social History  Substance Use Topics  . Smoking status: Never Smoker   . Smokeless tobacco: Never Used  . Alcohol Use: No   Family History  Problem Relation Age of Onset  . Alcohol abuse Father   . Cancer Paternal Aunt     breast  . Heart disease Paternal Grandmother   . Cancer Paternal Grandfather     prostate CA   No Known Allergies Current Outpatient Prescriptions on File Prior to Visit  Medication Sig Dispense Refill  . iron polysaccharides (NU-IRON) 150 MG capsule Take 1 capsule (150 mg total) by mouth daily. (Patient taking  differently: 150 mg every other day. ) 30 capsule 11  . naproxen (NAPROSYN) 500 MG tablet Take 1 tablet (500 mg total) by mouth 2 (two) times daily with a meal. 60 tablet 0  . PRENATAL VITAMINS PO Take one by mouth every other daily     No current facility-administered medications on file prior to visit.    Review of Systems Review of Systems  Constitutional: Negative for fever, appetite change, fatigue and unexpected weight change.  Eyes: Negative for pain and visual disturbance.  Respiratory: Negative for cough and shortness of breath.   Cardiovascular: Negative for cp or palpitations    Gastrointestinal: Negative for nausea, diarrhea and constipation.  Genitourinary: Negative for urgency and frequency.  Skin: Negative for pallor or rash   MSK pos for poor posture  Neurological: Negative for weakness, light-headedness, numbness and headaches.  Hematological: Negative for adenopathy. Does not bruise/bleed easily.  Psychiatric/Behavioral: Negative for dysphoric mood. The patient is not nervous/anxious.         Objective:   Physical Exam  Constitutional: She appears well-developed and well-nourished. No distress.  Well appearing   HENT:  Head: Normocephalic and atraumatic.  Right Ear: External ear normal.  Left Ear: External ear normal.  Nose: Nose normal.  Mouth/Throat: Oropharynx is clear and moist.  Eyes: Conjunctivae and EOM are normal. Pupils are equal, round, and reactive to light. Right eye exhibits no discharge. Left eye exhibits no discharge. No scleral icterus.  Neck: Normal range of motion. Neck supple. No JVD present. Carotid bruit is not present. No thyromegaly present.  Cardiovascular: Normal rate, regular rhythm, normal heart sounds and intact distal pulses.  Exam reveals no gallop.   Pulmonary/Chest: Effort normal and breath sounds normal. No respiratory distress. She has no wheezes. She has no rales.  Abdominal: Soft. Bowel sounds are normal. She exhibits no  distension and no mass. There is no tenderness.  Musculoskeletal: She exhibits no edema or tenderness.  No kyphosis   Lymphadenopathy:  She has no cervical adenopathy.  Neurological: She is alert. She has normal reflexes. No cranial nerve deficit. She exhibits normal muscle tone. Coordination normal.  Skin: Skin is warm and dry. No rash noted. No erythema. No pallor.  Psychiatric: She has a normal mood and affect.          Assessment & Plan:   Problem List Items Addressed This Visit      Other   Routine general medical examination at a health care facility    Reviewed health habits including diet and exercise and skin cancer prevention Reviewed appropriate screening tests for age  Also reviewed health mt list, fam hx and immunization status , as well as social and family history    See HPI Labs reviewed  Disc changing work station position and learning to type to help posture-along with exercise       Iron deficiency anemia - Primary    Improved From menses Hb of 11.2  Disc getting back on iron daily if tolerated  -until done with menses  No symptoms

## 2015-08-22 NOTE — Patient Instructions (Signed)
Consider getting a typing program to lean not to look at the keys when you type so your head remains upright  Take care of yourself  Think about an exercise program  Labs look good  Get some iron every day

## 2015-08-22 NOTE — Assessment & Plan Note (Signed)
Improved From menses Hb of 11.2  Disc getting back on iron daily if tolerated  -until done with menses  No symptoms

## 2015-08-22 NOTE — Assessment & Plan Note (Signed)
Reviewed health habits including diet and exercise and skin cancer prevention Reviewed appropriate screening tests for age  Also reviewed health mt list, fam hx and immunization status , as well as social and family history    See HPI Labs reviewed  Disc changing work station position and learning to type to help [employer] with exercise

## 2015-10-26 ENCOUNTER — Telehealth: Payer: Self-pay | Admitting: *Deleted

## 2015-10-26 NOTE — Telephone Encounter (Signed)
PT came in to have form filled out. Please call her when it is ready [phone]. Form placed in prescription tower.

## 2015-10-27 NOTE — Telephone Encounter (Signed)
Form is in your in box

## 2015-10-28 NOTE — Telephone Encounter (Signed)
Left voicemail letting pt know I need her waist circ, and to call me back

## 2015-10-28 NOTE — Telephone Encounter (Signed)
Done- pt will have to give you her waist circ

## 2015-11-02 NOTE — Telephone Encounter (Signed)
Pt came in states that her waist circumference  Is 30 inches. Please call when ready for pick up thank you

## 2015-11-03 NOTE — Telephone Encounter (Signed)
Left voicemail letting pt know form ready for pick up 

## 2016-06-06 LAB — HM MAMMOGRAPHY: HM Mammogram: NORMAL (ref 0–4)

## 2016-07-04 ENCOUNTER — Other Ambulatory Visit: Payer: Self-pay | Admitting: Obstetrics and Gynecology

## 2016-07-05 LAB — CYTOLOGY - PAP

## 2016-07-06 ENCOUNTER — Other Ambulatory Visit: Payer: Self-pay | Admitting: Obstetrics and Gynecology

## 2016-07-06 DIAGNOSIS — N644 Mastodynia: Secondary | ICD-10-CM

## 2016-07-09 ENCOUNTER — Ambulatory Visit
Admission: RE | Admit: 2016-07-09 | Discharge: 2016-07-09 | Disposition: A | Discharge status: Home / Self Care | Payer: 59 | Source: Ambulatory Visit | Attending: Obstetrics and Gynecology | Admitting: Obstetrics and Gynecology

## 2016-07-09 DIAGNOSIS — N644 Mastodynia: Secondary | ICD-10-CM

## 2016-08-19 ENCOUNTER — Telehealth: Payer: Self-pay | Admitting: Family Medicine

## 2016-08-19 DIAGNOSIS — D500 Iron deficiency anemia secondary to blood loss (chronic): Secondary | ICD-10-CM

## 2016-08-19 DIAGNOSIS — Z0000 Encounter for general adult medical examination without abnormal findings: Secondary | ICD-10-CM

## 2016-08-19 NOTE — Telephone Encounter (Signed)
-----   Message from Marchia Bond sent at 08/15/2016  3:44 PM EDT ----- Regarding: F/u labs Tues 7/17, need orders. Thanks! :-) Please order  future cpx labs for pt's upcoming lab appt. Thanks Aniceto Boss

## 2016-08-21 ENCOUNTER — Other Ambulatory Visit (INDEPENDENT_AMBULATORY_CARE_PROVIDER_SITE_OTHER): Payer: 59

## 2016-08-21 DIAGNOSIS — D500 Iron deficiency anemia secondary to blood loss (chronic): Secondary | ICD-10-CM | POA: Diagnosis not present

## 2016-08-21 DIAGNOSIS — Z0000 Encounter for general adult medical examination without abnormal findings: Secondary | ICD-10-CM | POA: Diagnosis not present

## 2016-08-21 LAB — CBC WITH DIFFERENTIAL/PLATELET
BASOS ABS: 0.1 10*3/uL (ref 0.0–0.1)
Basophils Relative: 0.9 % (ref 0.0–3.0)
EOS ABS: 0.1 10*3/uL (ref 0.0–0.7)
Eosinophils Relative: 2.2 % (ref 0.0–5.0)
HCT: 32.5 % — ABNORMAL LOW (ref 36.0–46.0)
HEMOGLOBIN: 10.8 g/dL — AB (ref 12.0–15.0)
LYMPHS ABS: 2 10*3/uL (ref 0.7–4.0)
Lymphocytes Relative: 30.4 % (ref 12.0–46.0)
MCHC: 33.2 g/dL (ref 30.0–36.0)
MCV: 83.1 fl (ref 78.0–100.0)
Monocytes Absolute: 0.5 10*3/uL (ref 0.1–1.0)
Monocytes Relative: 6.9 % (ref 3.0–12.0)
Neutro Abs: 4 10*3/uL (ref 1.4–7.7)
Neutrophils Relative %: 59.6 % (ref 43.0–77.0)
Platelets: 236 10*3/uL (ref 150.0–400.0)
RBC: 3.91 Mil/uL (ref 3.87–5.11)
RDW: 14.3 % (ref 11.5–15.5)
WBC: 6.6 10*3/uL (ref 4.0–10.5)

## 2016-08-21 LAB — FERRITIN: FERRITIN: 60.8 ng/mL (ref 10.0–291.0)

## 2016-08-21 LAB — COMPREHENSIVE METABOLIC PANEL
ALBUMIN: 3.9 g/dL (ref 3.5–5.2)
ALK PHOS: 74 U/L (ref 39–117)
ALT: 11 U/L (ref 0–35)
AST: 13 U/L (ref 0–37)
BUN: 13 mg/dL (ref 6–23)
CO2: 26 mEq/L (ref 19–32)
CREATININE: 0.86 mg/dL (ref 0.40–1.20)
Calcium: 9 mg/dL (ref 8.4–10.5)
Chloride: 107 mEq/L (ref 96–112)
GFR: 91.26 mL/min (ref >60.00)
GLUCOSE: 91 mg/dL (ref 70–99)
POTASSIUM: 3.8 meq/L (ref 3.5–5.1)
SODIUM: 140 meq/L (ref 135–145)
Total Bilirubin: 0.5 mg/dL (ref 0.2–1.2)
Total Protein: 7 g/dL (ref 6.0–8.3)

## 2016-08-21 LAB — TSH: TSH: 1.63 u[IU]/mL (ref 0.35–4.50)

## 2016-08-21 LAB — LIPID PANEL
Cholesterol: 128 mg/dL (ref 0–200)
HDL: 60.2 mg/dL (ref >39.00)
LDL Cholesterol: 57 mg/dL (ref 0–99)
NONHDL: 68.03
Total CHOL/HDL Ratio: 2
Triglycerides: 55 mg/dL (ref 0.0–149.0)
VLDL: 11 mg/dL (ref 0.0–40.0)

## 2016-08-28 ENCOUNTER — Ambulatory Visit (INDEPENDENT_AMBULATORY_CARE_PROVIDER_SITE_OTHER): Payer: 59 | Admitting: Family Medicine

## 2016-08-28 ENCOUNTER — Encounter: Payer: Self-pay | Admitting: Family Medicine

## 2016-08-28 VITALS — BP 118/74 | HR 90 | Temp 98.5°F | Ht 66.75 in | Wt 162.8 lb

## 2016-08-28 DIAGNOSIS — D500 Iron deficiency anemia secondary to blood loss (chronic): Secondary | ICD-10-CM | POA: Diagnosis not present

## 2016-08-28 DIAGNOSIS — Z0000 Encounter for general adult medical examination without abnormal findings: Secondary | ICD-10-CM | POA: Diagnosis not present

## 2016-08-28 MED ORDER — POLYSACCHARIDE IRON COMPLEX 150 MG PO CAPS: 150.0000 mg | ORAL_CAPSULE | ORAL | 3 refills | Status: DC

## 2016-08-28 MED ORDER — SCOPOLAMINE 1 MG/3DAYS TD PT72: 1.0000 | MEDICATED_PATCH | TRANSDERMAL | 1 refills | Status: DC

## 2016-08-28 NOTE — Assessment & Plan Note (Signed)
Hb 10.8 From heavy menses (pt does not want to tx) Urged to get back on nu iron qod  Continue to follow  Urged her to disc heavy menses with gyn

## 2016-08-28 NOTE — Progress Notes (Signed)
Subjective:    Patient ID: Brooke Gill, female    DOB: 01/30/1971, 46 y.o.   MRN: [id number]  HPI Here for health maintenance exam and to review chronic medical problems    Had a trip to [place]- it was excellent  Work related- wants to go back for fun  Working a lot   Going on cruise next mo -needs scop patch   [employer] from Last 3 Encounters:  08/28/16 162 lb 12 oz (73.8 kg)  08/22/15 158 lb 12 oz (72 kg)  08/17/14 157 lb 8 oz (71.4 kg)  taking care of herself  Exercise-working on it (no time to go to the gym)  Diet is very good  25.68 kg/m   Pap 5/18 at gyn neg  Mammogram nl 5/18 followed by [place] of tender area that was also negative  (has R axillary pain)  Regular heavy periods  Does not desire birth control  She switched to bra w/o underwire and it helped  Self exam-no lumps   May have carpal tunnel in R wrist   Neck does not bother her Working on posture   Tetanus shot 5/12   Hx of anemia from menses  Lab Results  Component Value Date   WBC 6.6 08/21/2016   HGB 10.8 (L) 08/21/2016   HCT 32.5 (L) 08/21/2016   MCV 83.1 08/21/2016   PLT 236.0 08/21/2016   Hb is down from 11.2 Iron - taking mvi but not the iron   Labs: Results for orders placed or performed in visit on 08/21/16  CBC with Differential/Platelet  Result Value Ref Range   WBC 6.6 4.0 - 10.5 K/uL   RBC 3.91 3.87 - 5.11 Mil/uL   Hemoglobin 10.8 (L) 12.0 - 15.0 g/dL   HCT 32.5 (L) 36.0 - 46.0 %   MCV 83.1 78.0 - 100.0 fl   MCHC 33.2 30.0 - 36.0 g/dL   RDW 14.3 11.5 - 15.5 %   Platelets 236.0 150.0 - 400.0 K/uL   Neutrophils Relative % 59.6 43.0 - 77.0 %   Lymphocytes Relative 30.4 12.0 - 46.0 %   Monocytes Relative 6.9 3.0 - 12.0 %   Eosinophils Relative 2.2 0.0 - 5.0 %   Basophils Relative 0.9 0.0 - 3.0 %   Neutro Abs 4.0 1.4 - 7.7 K/uL   Lymphs Abs 2.0 0.7 - 4.0 K/uL   Monocytes Absolute 0.5 0.1 - 1.0 K/uL   Eosinophils Absolute 0.1 0.0 - 0.7 K/uL   Basophils Absolute 0.1 0.0 -  0.1 K/uL  Comprehensive metabolic panel  Result Value Ref Range   Sodium 140 135 - 145 mEq/L   Potassium 3.8 3.5 - 5.1 mEq/L   Chloride 107 96 - 112 mEq/L   CO2 26 19 - 32 mEq/L   Glucose, Bld 91 70 - 99 mg/dL   BUN 13 6 - 23 mg/dL   Creatinine, Ser 0.86 0.40 - 1.20 mg/dL   Total Bilirubin 0.5 0.2 - 1.2 mg/dL   Alkaline Phosphatase 74 39 - 117 U/L   AST 13 0 - 37 U/L   ALT 11 0 - 35 U/L   Total Protein 7.0 6.0 - 8.3 g/dL   Albumin 3.9 3.5 - 5.2 g/dL   Calcium 9.0 8.4 - 10.5 mg/dL   GFR 91.26 >60.00 mL/min  Lipid panel  Result Value Ref Range   Cholesterol 128 0 - 200 mg/dL   Triglycerides 55.0 0.0 - 149.0 mg/dL   HDL 60.20 >39.00 mg/dL   VLDL  11.0 0.0 - 40.0 mg/dL   LDL Cholesterol 57 0 - 99 mg/dL   Total CHOL/HDL Ratio 2    NonHDL 68.03   TSH  Result Value Ref Range   TSH 1.63 0.35 - 4.50 uIU/mL  Ferritin  Result Value Ref Range   Ferritin 60.8 10.0 - 291.0 ng/mL    Iron stores are low end of normal  Needs to take her iron   Patient Active Problem List   Diagnosis Date Noted  . Routine general medical examination at a health care facility 06/19/2010  . Iron deficiency anemia 02/04/2008   Past Medical History:  Diagnosis Date  . Anemia    Past Surgical History:  Procedure Laterality Date  . OVARIAN CYST SURGERY     Social History  Substance Use Topics  . Smoking status: Never Smoker  . Smokeless tobacco: Never Used  . Alcohol use No   Family History  Problem Relation Age of Onset  . Alcohol abuse Father   . Cancer Paternal Aunt        breast  . Heart disease Paternal Grandmother   . Cancer Paternal Grandfather        prostate CA   No Known Allergies Current Outpatient Prescriptions on File Prior to Visit  Medication Sig Dispense Refill  . naproxen (NAPROSYN) 500 MG tablet Take 1 tablet (500 mg total) by mouth 2 (two) times daily with a meal. 60 tablet 0  . PRENATAL VITAMINS PO Take one by mouth every other daily     No current  facility-administered medications on file prior to visit.     Review of Systems Review of Systems  Constitutional: Negative for fever, appetite change, fatigue and unexpected weight change. pos for recent motion sickness on a boat  Eyes: Negative for pain and visual disturbance.  Respiratory: Negative for cough and shortness of breath.   Cardiovascular: Negative for cp or palpitations    Gastrointestinal: Negative for nausea, diarrhea and constipation.  Genitourinary: Negative for urgency and frequency. pos for heavy menses  Skin: Negative for pallor or rash   Neurological: Negative for weakness, light-headedness, numbness and headaches.  Hematological: Negative for adenopathy. Does not bruise/bleed easily.  Psychiatric/Behavioral: Negative for dysphoric mood. The patient is not nervous/anxious.         Objective:   Physical Exam  Constitutional: She appears well-developed and well-nourished. No distress.  Well appearing   HENT:  Head: Normocephalic and atraumatic.  Right Ear: External ear normal.  Left Ear: External ear normal.  Nose: Nose normal.  Mouth/Throat: Oropharynx is clear and moist.  Eyes: Pupils are equal, round, and reactive to light. Conjunctivae and EOM are normal. Right eye exhibits no discharge. Left eye exhibits no discharge. No scleral icterus.  Neck: Normal range of motion. Neck supple. No JVD present. Carotid bruit is not present. No thyromegaly present.  Cardiovascular: Normal rate, regular rhythm, normal heart sounds and intact distal pulses.  Exam reveals no gallop.   Pulmonary/Chest: Effort normal and breath sounds normal. No respiratory distress. She has no wheezes. She has no rales.  Abdominal: Soft. Bowel sounds are normal. She exhibits no distension and no mass. There is no tenderness.  Musculoskeletal: She exhibits no edema or tenderness.  Lymphadenopathy:    She has no cervical adenopathy.  Neurological: She is alert. She has normal reflexes. No  cranial nerve deficit. She exhibits normal muscle tone. Coordination normal.  Nl rom R wrist  Skin: Skin is warm and dry. No rash noted. No  erythema. No pallor.  Few brown nevi on back  Psychiatric: She has a normal mood and affect.  Cheerful and talkative           Assessment & Plan:   Problem List Items Addressed This Visit      Other   Iron deficiency anemia    Hb 10.8 From heavy menses (pt does not want to tx) Urged to get back on nu iron qod  Continue to follow  Urged her to disc heavy menses with gyn        Relevant Medications   iron polysaccharides (NU-IRON) 150 MG capsule   Routine general medical examination at a health care facility - Primary    Reviewed health habits including diet and exercise and skin cancer prevention Reviewed appropriate screening tests for age  Also reviewed health mt list, fam hx and immunization status , as well as social and family history   See HPI Need to treat anemia from menses  utd gyn care and mammograms  Labs reviewed  Great cholesterol utd imms  Scop patch px for upcoming cruise for motion sickness

## 2016-08-28 NOTE — Patient Instructions (Addendum)
Get a wrist splint for carpal tunnel - wear it on R wrist at night (this may help)  Your anemia is worse-please get back on iron    Take care of yourself   Labs look good - great cholesterol

## 2016-08-28 NOTE — Assessment & Plan Note (Addendum)
Reviewed health habits including diet and exercise and skin cancer prevention Reviewed appropriate screening tests for age  Also reviewed health mt list, fam hx and immunization status , as well as social and family history   See HPI Need to treat anemia from menses  utd gyn care and mammograms  Labs reviewed  Great cholesterol utd imms  Scop patch px for upcoming cruise for motion sickness

## 2017-03-26 ENCOUNTER — Ambulatory Visit: Payer: Self-pay | Admitting: *Deleted

## 2017-03-26 NOTE — Telephone Encounter (Signed)
Pt states she has been experiencing chest heaviness/pressure in chest for the past month.Pt states "I wouldn't describe it as pain, but it is uncomfortable". Pt states that the pain comes and goes and seems to correlate with belching.Pt rates the severity as mild and denies any other symptoms associated with having the discomfort. Pt states she notices that the pain comes with moving a certain way, but denies that the pain increases in frequency and severity. Appt made for 2/20. Pt advised to go to ED if other symptoms developed or if chest discomfort increased. Pt verbalized understanding.  Reason for Disposition . [1] Intermittent chest pain from "angina" AND [2] NO increase in severity or frequency  Answer Assessment - Initial Assessment Questions 1. LOCATION: "Where does it hurt?"       Middle of chest 2. RADIATION: "Does the pain go anywhere else?" (e.g., into neck, jaw, arms, back)     no 3. ONSET: "When did the chest pain begin?" (Minutes, hours or days)      Approximately one month ago 4. PATTERN "Does the pain come and go, or has it been constant since it started?"  "Does it get worse with exertion?"      Comes and goes, if she moves a certain way she feels it 5. DURATION: "How long does it last" (e.g., seconds, minutes, hours)     A couple of seconds 6. SEVERITY: "How bad is the pain?"  (e.g., Scale 1-10; mild, moderate, or severe)    - MILD (1-3): doesn't interfere with normal activities     - MODERATE (4-7): interferes with normal activities or awakens from sleep    - SEVERE (8-10): excruciating pain, unable to do any normal activities       Mild  7. CARDIAC RISK FACTORS: "Do you have any history of heart problems or risk factors for heart disease?" (e.g., prior heart attack, angina; high blood pressure, diabetes, being overweight, high cholesterol, smoking, or strong family history of heart disease)     Heart disease on dad's side, No personal history 8. PULMONARY RISK FACTORS:  "Do you have any history of lung disease?"  (e.g., blood clots in lung, asthma, emphysema, birth control pills)     No history of lung lung disease, no birth control  51. CAUSE: "What do you think is causing the chest pain?"     unsure 10. OTHER SYMPTOMS: "Do you have any other symptoms?" (e.g., dizziness, nausea, vomiting, sweating, fever, difficulty breathing, cough)       Short of breath with going up steps 11. PREGNANCY: "Is there any chance you are pregnant?" "When was your last menstrual period?"       No  Protocols used: CHEST PAIN-A-AH

## 2017-03-27 ENCOUNTER — Encounter: Payer: Self-pay | Admitting: Family Medicine

## 2017-03-27 ENCOUNTER — Ambulatory Visit: Payer: Managed Care, Other (non HMO) | Admitting: Family Medicine

## 2017-03-27 VITALS — BP 110/68 | HR 81 | Temp 98.1°F | Ht 66.75 in | Wt 169.2 lb

## 2017-03-27 DIAGNOSIS — R0789 Other chest pain: Secondary | ICD-10-CM | POA: Insufficient documentation

## 2017-03-27 MED ORDER — RANITIDINE HCL 150 MG PO TABS: 150.0000 mg | ORAL_TABLET | Freq: Two times a day (BID) | ORAL | 1 refills | Status: DC

## 2017-03-27 NOTE — Patient Instructions (Signed)
I think acid reflux may cause your chest symptoms and belching   Avoid carbonation and avoid chewing gum- it makes you swallow air  Look at the food handout for reflux also  Start ranitidine 150 mg twice daily for at least 2 weeks  Update me if not improved after 7 days  Update me if worse at any time   Work on stress situations- think about exercise as well

## 2017-03-27 NOTE — Assessment & Plan Note (Signed)
Atypical and assoc with belching Reassuring exam and ekg  Will tx for susp gerd with ranitidine 150 mg bid- 2 wk and update  Also addressed stressors that may inc GI acid  Also disc diet  Handouts given  Update if not starting to improve in a week or if worsening

## 2017-03-27 NOTE — Progress Notes (Signed)
Subjective:    Patient ID: Brooke Gill, female    DOB: 20-Jan-1971, 47 y.o.   MRN: [id number]  HPI Here for chest discomfort (a month)  Comes and goes Middle of chest  Does not feel like someone is standing on her chest  No sob     No sweating or nausea  occ correlates with belching (belching a lot)  Wonders if gerd  Heartburn -not often at all (and no regurgitation)  No n/v  No new medicines  No stool changes  No blood in her stool  No abdominal pain   etoh- none    Iron is not now  occ naproxen -not a lot (less than 1 every 6 mo)  Ibuprofen otc once every month or two No new vitamins   Stress - is through the roof - work  A lot of changes and will be likely asked to go back to old position that was very stressful (her replacement is struggling)  Thinking about a change  Has good support   Resting HR is low at home (50s)  Pulse Readings from Last 3 Encounters:  03/27/17 81  08/28/16 90  08/22/15 74     Not exercising - would like to  Sleep is ok   No change in diet    Wt Readings from Last 3 Encounters:  03/27/17 169 lb 4 oz (76.8 kg)  08/28/16 162 lb 12 oz (73.8 kg)  08/22/15 158 lb 12 oz (72 kg)   26.71 kg/m    EKG- NSR with rate of 82 and no acute changes   Patient Active Problem List   Diagnosis Date Noted  . Chest discomfort 03/27/2017  . Routine general medical examination at a health care facility 06/19/2010   Past Medical History:  Diagnosis Date  . Anemia    Past Surgical History:  Procedure Laterality Date  . OVARIAN CYST SURGERY     Social History   Tobacco Use  . Smoking status: Never Smoker  . Smokeless tobacco: Never Used  Substance Use Topics  . Alcohol use: No    Alcohol/week: 0.0 oz  . Drug use: No   Family History  Problem Relation Age of Onset  . Alcohol abuse Father   . Cancer Paternal Aunt        breast  . Heart disease Paternal Grandmother   . Cancer Paternal Grandfather        prostate CA   No Known  Allergies Current Outpatient Medications on File Prior to Visit  Medication Sig Dispense Refill  . iron polysaccharides (NU-IRON) 150 MG capsule Take 1 capsule (150 mg total) by mouth every other day. 45 capsule 3  . naproxen (NAPROSYN) 500 MG tablet Take 1 tablet (500 mg total) by mouth 2 (two) times daily with a meal. 60 tablet 0  . PRENATAL VITAMINS PO Take one by mouth every other daily    . scopolamine (TRANSDERM-SCOP, 1.5 MG,) 1 MG/3DAYS Place 1 patch (1.5 mg total) onto the skin every 3 (three) days. As needed for boat/motion sickness 4 patch 1   No current facility-administered medications on file prior to visit.      Review of Systems  Constitutional: Negative for activity change, appetite change, fatigue, fever and unexpected weight change.  HENT: Negative for congestion, ear pain, rhinorrhea, sinus pressure and sore throat.   Eyes: Negative for pain, redness and visual disturbance.  Respiratory: Negative for cough, shortness of breath and wheezing.   Cardiovascular: Negative for chest  pain and palpitations.       Chest discomfort/not pain  Gastrointestinal: Negative for abdominal distention, abdominal pain, anal bleeding, blood in stool, constipation, diarrhea, nausea, rectal pain and vomiting.       Pos for excessive belching   Endocrine: Negative for polydipsia and polyuria.  Genitourinary: Negative for dysuria, frequency and urgency.  Musculoskeletal: Negative for arthralgias, back pain and myalgias.  Skin: Negative for pallor and rash.  Allergic/Immunologic: Negative for environmental allergies.  Neurological: Negative for dizziness, syncope and headaches.  Hematological: Negative for adenopathy. Does not bruise/bleed easily.  Psychiatric/Behavioral: Negative for decreased concentration and dysphoric mood. The patient is not nervous/anxious.        Pos for stressors        Objective:   Physical Exam  Constitutional: She appears well-developed and well-nourished. No  distress.  Well appearing   HENT:  Head: Normocephalic and atraumatic.  Mouth/Throat: Oropharynx is clear and moist.  Eyes: Conjunctivae and EOM are normal. Pupils are equal, round, and reactive to light. No scleral icterus.  Neck: Normal range of motion. Neck supple.  Cardiovascular: Normal rate, regular rhythm and normal heart sounds.  Pulmonary/Chest: Effort normal and breath sounds normal. No respiratory distress. She has no wheezes. She has no rales. She exhibits no tenderness.  Abdominal: Soft. Bowel sounds are normal. She exhibits no distension, no abdominal bruit, no pulsatile midline mass and no mass. There is no tenderness. There is no rebound, no guarding, no CVA tenderness, no tenderness at McBurney's point and negative Murphy's sign.  Lymphadenopathy:    She has no cervical adenopathy.  Neurological: She is alert.  Skin: Skin is warm and dry. No erythema. No pallor.  Psychiatric: She has a normal mood and affect.          Assessment & Plan:   Problem List Items Addressed This Visit      Other   Chest discomfort - Primary    Atypical and assoc with belching Reassuring exam and ekg  Will tx for susp gerd with ranitidine 150 mg bid- 2 wk and update  Also addressed stressors that may inc GI acid  Also disc diet  Handouts given  Update if not starting to improve in a week or if worsening        Relevant Orders   EKG 12-Lead (Completed)

## 2017-08-25 ENCOUNTER — Telehealth: Payer: Self-pay | Admitting: Family Medicine

## 2017-08-25 DIAGNOSIS — Z0000 Encounter for general adult medical examination without abnormal findings: Secondary | ICD-10-CM

## 2017-08-25 NOTE — Telephone Encounter (Signed)
-----   Message from Ellamae Sia sent at 08/20/2017  3:34 PM EDT ----- Regarding:  Lab orders for Friday, 7.26.19 Patient is scheduled for CPX labs, please order future labs, Thanks , Karna Christmas

## 2017-08-30 ENCOUNTER — Other Ambulatory Visit (INDEPENDENT_AMBULATORY_CARE_PROVIDER_SITE_OTHER): Payer: Managed Care, Other (non HMO)

## 2017-08-30 DIAGNOSIS — Z0000 Encounter for general adult medical examination without abnormal findings: Secondary | ICD-10-CM | POA: Diagnosis not present

## 2017-08-30 LAB — COMPREHENSIVE METABOLIC PANEL
ALT: 10 U/L (ref 0–35)
AST: 13 U/L (ref 0–37)
Albumin: 4 g/dL (ref 3.5–5.2)
Alkaline Phosphatase: 78 U/L (ref 39–117)
BUN: 11 mg/dL (ref 6–23)
CHLORIDE: 107 meq/L (ref 96–112)
CO2: 25 mEq/L (ref 19–32)
Calcium: 8.9 mg/dL (ref 8.4–10.5)
Creatinine, Ser: 0.97 mg/dL (ref 0.40–1.20)
GFR: 79.08 mL/min (ref >60.00)
GLUCOSE: 94 mg/dL (ref 70–99)
Potassium: 3.5 mEq/L (ref 3.5–5.1)
SODIUM: 141 meq/L (ref 135–145)
Total Bilirubin: 0.5 mg/dL (ref 0.2–1.2)
Total Protein: 7.4 g/dL (ref 6.0–8.3)

## 2017-08-30 LAB — CBC WITH DIFFERENTIAL/PLATELET
BASOS PCT: 1.3 % (ref 0.0–3.0)
Basophils Absolute: 0.1 10*3/uL (ref 0.0–0.1)
Eosinophils Absolute: 0.1 10*3/uL (ref 0.0–0.7)
Eosinophils Relative: 2.1 % (ref 0.0–5.0)
HCT: 32.1 % — ABNORMAL LOW (ref 36.0–46.0)
HEMOGLOBIN: 10.6 g/dL — AB (ref 12.0–15.0)
LYMPHS ABS: 2 10*3/uL (ref 0.7–4.0)
Lymphocytes Relative: 32.8 % (ref 12.0–46.0)
MCHC: 33.1 g/dL (ref 30.0–36.0)
MCV: 82.9 fl (ref 78.0–100.0)
MONO ABS: 0.5 10*3/uL (ref 0.1–1.0)
Monocytes Relative: 8 % (ref 3.0–12.0)
Neutro Abs: 3.3 10*3/uL (ref 1.4–7.7)
Neutrophils Relative %: 55.8 % (ref 43.0–77.0)
Platelets: 267 10*3/uL (ref 150.0–400.0)
RBC: 3.87 Mil/uL (ref 3.87–5.11)
RDW: 14.2 % (ref 11.5–15.5)
WBC: 6 10*3/uL (ref 4.0–10.5)

## 2017-08-30 LAB — LIPID PANEL
CHOL/HDL RATIO: 2
Cholesterol: 128 mg/dL (ref 0–200)
HDL: 53.5 mg/dL (ref >39.00)
LDL CALC: 64 mg/dL (ref 0–99)
NONHDL: 74.34
Triglycerides: 54 mg/dL (ref 0.0–149.0)
VLDL: 10.8 mg/dL (ref 0.0–40.0)

## 2017-08-30 LAB — TSH: TSH: 1.04 u[IU]/mL (ref 0.35–4.50)

## 2017-09-02 ENCOUNTER — Ambulatory Visit (INDEPENDENT_AMBULATORY_CARE_PROVIDER_SITE_OTHER): Payer: Managed Care, Other (non HMO) | Admitting: Family Medicine

## 2017-09-02 ENCOUNTER — Encounter: Payer: Self-pay | Admitting: Family Medicine

## 2017-09-02 VITALS — BP 106/66 | HR 91 | Temp 98.4°F | Ht 66.5 in | Wt 162.8 lb

## 2017-09-02 DIAGNOSIS — D500 Iron deficiency anemia secondary to blood loss (chronic): Secondary | ICD-10-CM | POA: Diagnosis not present

## 2017-09-02 DIAGNOSIS — D649 Anemia, unspecified: Secondary | ICD-10-CM | POA: Insufficient documentation

## 2017-09-02 DIAGNOSIS — Z0000 Encounter for general adult medical examination without abnormal findings: Secondary | ICD-10-CM | POA: Diagnosis not present

## 2017-09-02 NOTE — Progress Notes (Signed)
Subjective:    Patient ID: Brooke Gill, female    DOB: 1970-11-05, 47 y.o.   MRN: [id number]  HPI Here for health maintenance exam and to review chronic medical problems    Feeling good  Trying to take care of herself  No time for exercise - works too much  She is starting to look for another job   [facility] Readings from Last 3 Encounters:  09/02/17 162 lb 12 oz (73.8 kg)  03/27/17 169 lb 4 oz (76.8 kg)  08/28/16 162 lb 12 oz (73.8 kg)  stable  Eating a healthy diet  25.88 kg/m   Mammogram 5/19 at gyn/ Dr Ouida Sills  Self breast exam -no lumps   Flu shot 8/19 Tetanus shot 5/12  Pap 5/19 neg with gyn  Menses - still long- lasting a week or more  Disc poss ablation with her gyn also  Heavy menses    H/o anemia from menses Lab Results  Component Value Date   WBC 6.0 08/30/2017   HGB 10.6 (L) 08/30/2017   HCT 32.1 (L) 08/30/2017   MCV 82.9 08/30/2017   PLT 267.0 08/30/2017   this is fairly stable  Iron - does not tolerate  Taking pnv with iron   BP Readings from Last 3 Encounters:  09/02/17 106/66  03/27/17 110/68  08/28/16 118/74   Pulse Readings from Last 3 Encounters:  09/02/17 91  03/27/17 81  08/28/16 90    Cholesterol Lab Results  Component Value Date   CHOL 128 08/30/2017   CHOL 128 08/21/2016   CHOL 139 08/15/2015   Lab Results  Component Value Date   HDL 53.50 08/30/2017   HDL 60.20 08/21/2016   HDL 62.50 08/15/2015   Lab Results  Component Value Date   LDLCALC 64 08/30/2017   LDLCALC 57 08/21/2016   LDLCALC 66 08/15/2015   Lab Results  Component Value Date   TRIG 54.0 08/30/2017   TRIG 55.0 08/21/2016   TRIG 50.0 08/15/2015   Lab Results  Component Value Date   CHOLHDL 2 08/30/2017   CHOLHDL 2 08/21/2016   CHOLHDL 2 08/15/2015   No results found for: LDLDIRECT Very good cholesterol profile   Other labs  Results for orders placed or performed in visit on 08/30/17  TSH  Result Value Ref Range   TSH 1.04 0.35 - 4.50 uIU/mL    Lipid panel  Result Value Ref Range   Cholesterol 128 0 - 200 mg/dL   Triglycerides 54.0 0.0 - 149.0 mg/dL   HDL 53.50 >39.00 mg/dL   VLDL 10.8 0.0 - 40.0 mg/dL   LDL Cholesterol 64 0 - 99 mg/dL   Total CHOL/HDL Ratio 2    NonHDL 74.34   Comprehensive metabolic panel  Result Value Ref Range   Sodium 141 135 - 145 mEq/L   Potassium 3.5 3.5 - 5.1 mEq/L   Chloride 107 96 - 112 mEq/L   CO2 25 19 - 32 mEq/L   Glucose, Bld 94 70 - 99 mg/dL   BUN 11 6 - 23 mg/dL   Creatinine, Ser 0.97 0.40 - 1.20 mg/dL   Total Bilirubin 0.5 0.2 - 1.2 mg/dL   Alkaline Phosphatase 78 39 - 117 U/L   AST 13 0 - 37 U/L   ALT 10 0 - 35 U/L   Total Protein 7.4 6.0 - 8.3 g/dL   Albumin 4.0 3.5 - 5.2 g/dL   Calcium 8.9 8.4 - 10.5 mg/dL   GFR 79.08 >60.00 mL/min  CBC  with Differential/Platelet  Result Value Ref Range   WBC 6.0 4.0 - 10.5 K/uL   RBC 3.87 3.87 - 5.11 Mil/uL   Hemoglobin 10.6 (L) 12.0 - 15.0 g/dL   HCT 32.1 (L) 36.0 - 46.0 %   MCV 82.9 78.0 - 100.0 fl   MCHC 33.1 30.0 - 36.0 g/dL   RDW 14.2 11.5 - 15.5 %   Platelets 267.0 150.0 - 400.0 K/uL   Neutrophils Relative % 55.8 43.0 - 77.0 %   Lymphocytes Relative 32.8 12.0 - 46.0 %   Monocytes Relative 8.0 3.0 - 12.0 %   Eosinophils Relative 2.1 0.0 - 5.0 %   Basophils Relative 1.3 0.0 - 3.0 %   Neutro Abs 3.3 1.4 - 7.7 K/uL   Lymphs Abs 2.0 0.7 - 4.0 K/uL   Monocytes Absolute 0.5 0.1 - 1.0 K/uL   Eosinophils Absolute 0.1 0.0 - 0.7 K/uL   Basophils Absolute 0.1 0.0 - 0.1 K/uL    Patient Active Problem List   Diagnosis Date Noted  . Chest discomfort 03/27/2017  . Routine general medical examination at a health care facility 06/19/2010   Past Medical History:  Diagnosis Date  . Anemia    Past Surgical History:  Procedure Laterality Date  . OVARIAN CYST SURGERY     Social History   Tobacco Use  . Smoking status: Never Smoker  . Smokeless tobacco: Never Used  Substance Use Topics  . Alcohol use: No    Alcohol/week: 0.0 oz  .  Drug use: No   Family History  Problem Relation Age of Onset  . Alcohol abuse Father   . Cancer Paternal Aunt        breast  . Heart disease Paternal Grandmother   . Cancer Paternal Grandfather        prostate CA   Allergies  Allergen Reactions  . Iron     GI side effect   Current Outpatient Medications on File Prior to Visit  Medication Sig Dispense Refill  . naproxen (NAPROSYN) 500 MG tablet Take 1 tablet (500 mg total) by mouth 2 (two) times daily with a meal. 60 tablet 0  . PRENATAL VITAMINS PO Take one by mouth every other daily    . scopolamine (TRANSDERM-SCOP, 1.5 MG,) 1 MG/3DAYS Place 1 patch (1.5 mg total) onto the skin every 3 (three) days. As needed for boat/motion sickness 4 patch 1   No current facility-administered medications on file prior to visit.     Review of Systems  Constitutional: Positive for fatigue. Negative for activity change, appetite change, fever and unexpected weight change.  HENT: Negative for congestion, ear pain, rhinorrhea, sinus pressure and sore throat.   Eyes: Negative for pain, redness and visual disturbance.  Respiratory: Negative for cough, shortness of breath and wheezing.   Cardiovascular: Negative for chest pain and palpitations.  Gastrointestinal: Negative for abdominal pain, blood in stool, constipation and diarrhea.  Endocrine: Negative for polydipsia and polyuria.  Genitourinary: Positive for menstrual problem. Negative for dysuria, frequency and urgency.  Musculoskeletal: Negative for arthralgias, back pain and myalgias.  Skin: Negative for pallor and rash.  Allergic/Immunologic: Negative for environmental allergies.  Neurological: Negative for dizziness, syncope and headaches.  Hematological: Negative for adenopathy. Does not bruise/bleed easily.  Psychiatric/Behavioral: Negative for decreased concentration and dysphoric mood. The patient is not nervous/anxious.        Objective:   Physical Exam  Constitutional: She appears  well-developed and well-nourished. No distress.  Well appearing   HENT:  Head: Normocephalic and atraumatic.  Right Ear: External ear normal.  Left Ear: External ear normal.  Nose: Nose normal.  Mouth/Throat: Oropharynx is clear and moist.  Eyes: Pupils are equal, round, and reactive to light. Conjunctivae and EOM are normal. Right eye exhibits no discharge. Left eye exhibits no discharge. No scleral icterus.  Neck: Normal range of motion. Neck supple. No JVD present. Carotid bruit is not present. No thyromegaly present.  Cardiovascular: Normal rate, regular rhythm, normal heart sounds and intact distal pulses. Exam reveals no gallop.  Pulmonary/Chest: Effort normal and breath sounds normal. No respiratory distress. She has no wheezes. She has no rales.  Abdominal: Soft. Bowel sounds are normal. She exhibits no distension and no mass. There is no tenderness.  Musculoskeletal: She exhibits no edema, tenderness or deformity.  Lymphadenopathy:    She has no cervical adenopathy.  Neurological: She is alert. She has normal reflexes. She displays normal reflexes. No cranial nerve deficit. She exhibits normal muscle tone. Coordination normal.  Skin: Skin is warm and dry. No rash noted. No erythema. No pallor.  Few lentigines  Psychiatric: She has a normal mood and affect. Her mood appears not anxious. She does not exhibit a depressed mood.  Pleasant  Voices stressors from job           Assessment & Plan:   Problem List Items Addressed This Visit      Other   Anemia    Suspect due to heavy menses Enc her to d/w gyn- poss ablation  She does not tolerate oral iron at all  May need IV in future if worse       Routine general medical examination at a health care facility - Primary    Reviewed health habits including diet and exercise and skin cancer prevention Reviewed appropriate screening tests for age  Also reviewed health mt list, fam hx and immunization status , as well as social  and family history   Labs reviewed  Working a lot- no time for self care  utd gyn care-sent for records  Enc her to disc procedure for heavy menses causing anemia

## 2017-09-02 NOTE — Assessment & Plan Note (Signed)
Reviewed health habits including diet and exercise and skin cancer prevention Reviewed appropriate screening tests for age  Also reviewed health mt list, fam hx and immunization status , as well as social and family history   Labs reviewed  Working a lot- no time for self care  utd gyn care-sent for records  Enc her to disc procedure for heavy menses causing anemia

## 2017-09-02 NOTE — Assessment & Plan Note (Signed)
Suspect due to heavy menses Enc her to d/w gyn- poss ablation  She does not tolerate oral iron at all  May need IV in future if worse

## 2017-09-02 NOTE — Patient Instructions (Signed)
Talk to your gyn about procedure for heavy period  You are still anemic Eat high iron foods Take care of yourself  Labs look good

## 2018-09-01 ENCOUNTER — Telehealth: Payer: Self-pay | Admitting: Family Medicine

## 2018-09-01 DIAGNOSIS — D500 Iron deficiency anemia secondary to blood loss (chronic): Secondary | ICD-10-CM

## 2018-09-01 DIAGNOSIS — Z0000 Encounter for general adult medical examination without abnormal findings: Secondary | ICD-10-CM

## 2018-09-01 NOTE — Telephone Encounter (Signed)
-----   Message from Ellamae Sia sent at 08/26/2018 10:35 AM EDT ----- Regarding: Lab orders for Tuesdday, 7.28.20 Patient is scheduled for CPX labs, please order future labs, Thanks , Karna Christmas

## 2018-09-02 ENCOUNTER — Other Ambulatory Visit (INDEPENDENT_AMBULATORY_CARE_PROVIDER_SITE_OTHER): Payer: Managed Care, Other (non HMO)

## 2018-09-02 ENCOUNTER — Other Ambulatory Visit: Payer: Self-pay

## 2018-09-02 DIAGNOSIS — D500 Iron deficiency anemia secondary to blood loss (chronic): Secondary | ICD-10-CM | POA: Diagnosis not present

## 2018-09-02 DIAGNOSIS — Z0000 Encounter for general adult medical examination without abnormal findings: Secondary | ICD-10-CM

## 2018-09-02 LAB — CBC WITH DIFFERENTIAL/PLATELET
Basophils Absolute: 0.1 10*3/uL (ref 0.0–0.1)
Basophils Relative: 0.9 % (ref 0.0–3.0)
Eosinophils Absolute: 0.1 10*3/uL (ref 0.0–0.7)
Eosinophils Relative: 2.2 % (ref 0.0–5.0)
HCT: 32.8 % — ABNORMAL LOW (ref 36.0–46.0)
Hemoglobin: 10.7 g/dL — ABNORMAL LOW (ref 12.0–15.0)
Lymphocytes Relative: 31.2 % (ref 12.0–46.0)
Lymphs Abs: 2 10*3/uL (ref 0.7–4.0)
MCHC: 32.5 g/dL (ref 30.0–36.0)
MCV: 84.8 fl (ref 78.0–100.0)
Monocytes Absolute: 0.5 10*3/uL (ref 0.1–1.0)
Monocytes Relative: 7.3 % (ref 3.0–12.0)
Neutro Abs: 3.7 10*3/uL (ref 1.4–7.7)
Neutrophils Relative %: 58.4 % (ref 43.0–77.0)
Platelets: 225 10*3/uL (ref 150.0–400.0)
RBC: 3.87 Mil/uL (ref 3.87–5.11)
RDW: 14.4 % (ref 11.5–15.5)
WBC: 6.4 10*3/uL (ref 4.0–10.5)

## 2018-09-02 LAB — COMPREHENSIVE METABOLIC PANEL
ALT: 11 U/L (ref 0–35)
AST: 12 U/L (ref 0–37)
Albumin: 4.1 g/dL (ref 3.5–5.2)
Alkaline Phosphatase: 77 U/L (ref 39–117)
BUN: 9 mg/dL (ref 6–23)
CO2: 25 mEq/L (ref 19–32)
Calcium: 9.3 mg/dL (ref 8.4–10.5)
Chloride: 107 mEq/L (ref 96–112)
Creatinine, Ser: 0.9 mg/dL (ref 0.40–1.20)
GFR: 80.77 mL/min (ref >60.00)
Glucose, Bld: 98 mg/dL (ref 70–99)
Potassium: 3.8 mEq/L (ref 3.5–5.1)
Sodium: 140 mEq/L (ref 135–145)
Total Bilirubin: 0.5 mg/dL (ref 0.2–1.2)
Total Protein: 6.9 g/dL (ref 6.0–8.3)

## 2018-09-02 LAB — LIPID PANEL
Cholesterol: 130 mg/dL (ref 0–200)
HDL: 57.9 mg/dL (ref >39.00)
LDL Cholesterol: 61 mg/dL (ref 0–99)
NonHDL: 71.91
Total CHOL/HDL Ratio: 2
Triglycerides: 53 mg/dL (ref 0.0–149.0)
VLDL: 10.6 mg/dL (ref 0.0–40.0)

## 2018-09-02 LAB — TSH: TSH: 1.83 u[IU]/mL (ref 0.35–4.50)

## 2018-09-02 LAB — FERRITIN: Ferritin: 74 ng/mL (ref 10.0–291.0)

## 2018-09-05 ENCOUNTER — Ambulatory Visit (INDEPENDENT_AMBULATORY_CARE_PROVIDER_SITE_OTHER): Payer: Managed Care, Other (non HMO) | Admitting: Family Medicine

## 2018-09-05 ENCOUNTER — Encounter: Payer: Self-pay | Admitting: Family Medicine

## 2018-09-05 ENCOUNTER — Other Ambulatory Visit: Payer: Self-pay

## 2018-09-05 VITALS — BP 104/69 | HR 69 | Temp 97.9°F | Ht 66.25 in | Wt 162.4 lb

## 2018-09-05 DIAGNOSIS — D500 Iron deficiency anemia secondary to blood loss (chronic): Secondary | ICD-10-CM | POA: Diagnosis not present

## 2018-09-05 DIAGNOSIS — Z0000 Encounter for general adult medical examination without abnormal findings: Secondary | ICD-10-CM

## 2018-09-05 MED ORDER — POLYSACCHARIDE IRON COMPLEX 150 MG PO CAPS: 150.0000 mg | ORAL_CAPSULE | Freq: Every day | ORAL | 11 refills | Status: DC

## 2018-09-05 NOTE — Patient Instructions (Addendum)
Get a flu shot in the fall  Follow up with gyn in august   Take a copy of your labs Discuss your periods    Take the once daily iron  Keep trying to eat better -get creative with vegetables   Keep walking   Cholesterol is great   Try the standing desk Consider a new mattress if it is time  Try a cervical support pillow as well  If arm keeps bothering you - let us know

## 2018-09-05 NOTE — Progress Notes (Signed)
Subjective:    Patient ID: Brooke Gill, female    DOB: [birth year], 48 y.o.   MRN: [id number]  HPI Here for health maintenance exam and to review chronic medical problems    Feeling ok overall   Her R arm gets numb some times  Dull ache  Also pain under arm  Neck does hurt at times Has a stand up desk to correct posture- has used it a few times  Will see if that helps   Wt Readings from Last 3 Encounters:  09/05/18 162 lb 6 oz (73.7 kg)  09/02/17 162 lb 12 oz (73.8 kg)  03/27/17 169 lb 4 oz (76.8 kg)  down since February She started walking - goal is get to 150 lb  Diet- not healthy (does not eat vegetables) - she has a mental/texture thing  Has eaten some cooked onions  26.01 kg/m   She does eat fruit- bananas and oranges  Smoothie-frozen fruit and apple juice   Mammogram 5/18-has one planned 09/11/18  Self breast exam -no lumps   Flu vaccine 1/19-will get  in the fall  Tdap 5/12  Pap 5/18 -neg at gyn -gets annual with mammogram  Menses - still getting them  Still regular  Not as heavy as used to be - about 80%  Still lasting 5 days  Does pass clots  Not a lot of cramping  BP Readings from Last 3 Encounters:  09/05/18 104/69  09/02/17 106/66  03/27/17 110/68   Pulse Readings from Last 3 Encounters:  09/05/18 69  09/02/17 91  03/27/17 81    Cholesterol Lab Results  Component Value Date   CHOL 130 09/02/2018   CHOL 128 08/30/2017   CHOL 128 08/21/2016   Lab Results  Component Value Date   HDL 57.90 09/02/2018   HDL 53.50 08/30/2017   HDL 60.20 08/21/2016   Lab Results  Component Value Date   LDLCALC 61 09/02/2018   Carly[place] 64 08/30/2017   LDLCALC 57 08/21/2016   Lab Results  Component Value Date   TRIG 53.0 09/02/2018   TRIG 54.0 08/30/2017   TRIG 55.0 08/21/2016   Lab Results  Component Value Date   CHOLHDL 2 09/02/2018   CHOLHDL 2 08/30/2017   CHOLHDL 2 08/21/2016   No results found for: LDLDIRECT Naturally low cholesterol  She  eats fried foods    Anemia (heavy msnses)  Lab Results  Component Value Date   WBC 6.4 09/02/2018   HGB 10.7 (L) 09/02/2018   HCT 32.8 (L) 09/02/2018   MCV 84.8 09/02/2018   PLT 225.0 09/02/2018  stable from previous    Iron supplementation -not regular  She is "lazy"    Other labs Lab Results  Component Value Date   CREATININE 0.90 09/02/2018   BUN 9 09/02/2018   NA 140 09/02/2018   K 3.8 09/02/2018   CL 107 09/02/2018   CO2 25 09/02/2018   Lab Results  Component Value Date   ALT 11 09/02/2018   AST 12 09/02/2018   ALKPHOS 77 09/02/2018   BILITOT 0.5 09/02/2018   Lab Results  Component Value Date   TSH 1.83 09/02/2018    Patient Active Problem List   Diagnosis Date Noted  . Anemia 09/02/2017  . Chest discomfort 03/27/2017  . Routine general medical examination at a health care facility 06/19/2010   Past Medical History:  Diagnosis Date  . Anemia    Past Surgical History:  Procedure Laterality Date  . OVARIAN CYST  SURGERY     Social History   Tobacco Use  . Smoking status: Never Smoker  . Smokeless tobacco: Never Used  Substance Use Topics  . Alcohol use: No    Alcohol/week: 0.0 standard drinks  . Drug use: No   Family History  Problem Relation Age of Onset  . Alcohol abuse Father   . Cancer Paternal Aunt        breast  . Heart disease Paternal Grandmother   . Cancer Paternal Grandfather        prostate CA   Allergies  Allergen Reactions  . Iron     GI side effect   Current Outpatient Medications on File Prior to Visit  Medication Sig Dispense Refill  . naproxen (NAPROSYN) 500 MG tablet Take 1 tablet (500 mg total) by mouth 2 (two) times daily with a meal. 60 tablet 0  . PRENATAL VITAMINS PO Take one by mouth every other daily     No current facility-administered medications on file prior to visit.      Review of Systems  Constitutional: Negative for activity change, appetite change, fatigue, fever and unexpected weight change.   HENT: Negative for congestion, ear pain, rhinorrhea, sinus pressure and sore throat.   Eyes: Negative for pain, redness and visual disturbance.  Respiratory: Negative for cough, shortness of breath and wheezing.   Cardiovascular: Negative for chest pain and palpitations.  Gastrointestinal: Negative for abdominal pain, blood in stool, constipation and diarrhea.  Endocrine: Negative for polydipsia and polyuria.  Genitourinary: Negative for dysuria, frequency and urgency.       Heavy menses   Musculoskeletal: Positive for neck pain. Negative for arthralgias, back pain and myalgias.  Skin: Negative for pallor and rash.  Allergic/Immunologic: Negative for environmental allergies.  Neurological: Negative for dizziness, syncope and headaches.       Arm tingling   Hematological: Negative for adenopathy. Does not bruise/bleed easily.  Psychiatric/Behavioral: Negative for decreased concentration and dysphoric mood. The patient is not nervous/anxious.        Objective:   Physical Exam Constitutional:      General: She is not in acute distress.    Appearance: Normal appearance. She is well-developed and normal weight. She is not ill-appearing or diaphoretic.  HENT:     Head: Normocephalic and atraumatic.     Right Ear: Tympanic membrane, ear canal and external ear normal.     Left Ear: Tympanic membrane, ear canal and external ear normal.     Nose: Nose normal.     Mouth/Throat:     Mouth: Mucous membranes are moist.     Pharynx: Oropharynx is clear. No posterior oropharyngeal erythema.  Eyes:     General: No scleral icterus.       Right eye: No discharge.        Left eye: No discharge.     Conjunctiva/sclera: Conjunctivae normal.     Pupils: Pupils are equal, round, and reactive to light.  Neck:     Musculoskeletal: Normal range of motion and neck supple. No neck rigidity or muscular tenderness.     Thyroid: No thyromegaly.     Vascular: No carotid bruit or JVD.  Cardiovascular:      Rate and Rhythm: Normal rate and regular rhythm.     Pulses: Normal pulses.     Heart sounds: Normal heart sounds. No gallop.   Pulmonary:     Effort: Pulmonary effort is normal. No respiratory distress.     Breath sounds: Normal breath  sounds. No wheezing or rales.  Abdominal:     General: Bowel sounds are normal. There is no distension.     Palpations: Abdomen is soft. There is no mass.     Tenderness: There is no abdominal tenderness.     Hernia: No hernia is present.  Musculoskeletal:        General: No tenderness or deformity.     Right lower leg: No edema.     Left lower leg: No edema.     Comments: Limited form of CS due to discomfort  Lymphadenopathy:     Cervical: No cervical adenopathy.  Skin:    General: Skin is warm and dry.     Coloration: Skin is not pale.     Findings: No erythema, lesion or rash.     Comments: Some skin tags   Neurological:     Mental Status: She is alert.     Cranial Nerves: No cranial nerve deficit.     Motor: No abnormal muscle tone.     Coordination: Coordination normal.     Gait: Gait normal.     Deep Tendon Reflexes: Reflexes are normal and symmetric. Reflexes normal.  Psychiatric:        Mood and Affect: Mood normal.           Assessment & Plan:   Problem List Items Addressed This Visit      Other   Routine general medical examination at a health care facility - Primary    Reviewed health habits including diet and exercise and skin cancer prevention Reviewed appropriate screening tests for age  Also reviewed health mt list, fam hx and immunization status , as well as social and family history   See HPI Labs reviewed  Pt plans to f/u with gyn for exam  Has mammogram planned 09/11/18 Will get flu shot in the fall  Enc healthy diet and exercise       Anemia    Suspect due to heavy menses Px niferex 150 mg daily  F/u with gyn as planned for exam and to discuss further      Relevant Medications   iron polysaccharides  (NIFEREX) 150 MG capsule

## 2018-09-07 NOTE — Assessment & Plan Note (Signed)
Suspect due to heavy menses Px niferex 150 mg daily  F/u with gyn as planned for exam and to discuss further

## 2018-09-07 NOTE — Assessment & Plan Note (Signed)
Reviewed health habits including diet and exercise and skin cancer prevention Reviewed appropriate screening tests for age  Also reviewed health mt list, fam hx and immunization status , as well as social and family history   See HPI Labs reviewed  Pt plans to f/u with gyn for exam  Has mammogram planned 09/11/18 Will get flu shot in the fall  Enc healthy diet and exercise

## 2018-09-15 ENCOUNTER — Other Ambulatory Visit: Payer: Self-pay | Admitting: Obstetrics and Gynecology

## 2018-09-15 DIAGNOSIS — R928 Other abnormal and inconclusive findings on diagnostic imaging of breast: Secondary | ICD-10-CM

## 2018-09-18 ENCOUNTER — Other Ambulatory Visit: Payer: Self-pay

## 2018-09-18 ENCOUNTER — Other Ambulatory Visit: Payer: Self-pay | Admitting: Obstetrics and Gynecology

## 2018-09-18 ENCOUNTER — Ambulatory Visit
Admission: RE | Admit: 2018-09-18 | Discharge: 2018-09-18 | Disposition: A | Discharge status: Home / Self Care | Payer: Managed Care, Other (non HMO) | Source: Ambulatory Visit | Attending: Obstetrics and Gynecology | Admitting: Obstetrics and Gynecology

## 2018-09-18 DIAGNOSIS — R928 Other abnormal and inconclusive findings on diagnostic imaging of breast: Secondary | ICD-10-CM

## 2018-09-18 DIAGNOSIS — R921 Mammographic calcification found on diagnostic imaging of breast: Secondary | ICD-10-CM

## 2018-09-19 ENCOUNTER — Ambulatory Visit
Admission: RE | Admit: 2018-09-19 | Discharge: 2018-09-19 | Disposition: A | Discharge status: Home / Self Care | Payer: Managed Care, Other (non HMO) | Source: Ambulatory Visit | Attending: Obstetrics and Gynecology | Admitting: Obstetrics and Gynecology

## 2018-09-19 ENCOUNTER — Other Ambulatory Visit: Payer: Self-pay | Admitting: Obstetrics and Gynecology

## 2018-09-19 DIAGNOSIS — R921 Mammographic calcification found on diagnostic imaging of breast: Secondary | ICD-10-CM

## 2019-03-04 ENCOUNTER — Encounter: Payer: Self-pay | Admitting: Family Medicine

## 2019-03-04 ENCOUNTER — Other Ambulatory Visit: Payer: Self-pay

## 2019-03-04 ENCOUNTER — Ambulatory Visit: Payer: Managed Care, Other (non HMO) | Admitting: Family Medicine

## 2019-03-04 VITALS — BP 102/76 | HR 78 | Temp 97.0°F | Ht 66.0 in | Wt 163.5 lb

## 2019-03-04 DIAGNOSIS — F430 Acute stress reaction: Secondary | ICD-10-CM | POA: Insufficient documentation

## 2019-03-04 DIAGNOSIS — R0789 Other chest pain: Secondary | ICD-10-CM | POA: Diagnosis not present

## 2019-03-04 MED ORDER — FAMOTIDINE 40 MG PO TABS: 40.0000 mg | ORAL_TABLET | Freq: Every day | ORAL | 5 refills | Status: DC

## 2019-03-04 NOTE — Assessment & Plan Note (Signed)
With excessive burping/belching and gas  Worsened by stress  Atypical/not exertional  EKG is reassuring-no changes  Discussed diet / avoid carbonation and spicy foods  Disc stressors and offered counseling ref in the future if needed  Px famotidine 40 mg to take one pill daily  Enc to update if symptoms worsen (if sudden-go to ER)  Update if not starting to improve in a week or if worsening   Handout re: GERd given

## 2019-03-04 NOTE — Progress Notes (Signed)
Subjective:    Patient ID: Brooke Gill, female    DOB: [birth year], 49 y.o.   MRN: [id number]  This visit occurred during the SARS-CoV-2 public health emergency.  Safety protocols were in place, including screening questions prior to the visit, additional usage of staff PPE, and extensive cleaning of exam room while observing appropriate contact time as indicated for disinfecting solutions.    HPI Pt presents with acid and belching/gas/chest discomfort She has had this once before during a time of stress    Wt Readings from Last 3 Encounters:  09/05/18 162 lb 6 oz (73.7 kg)  09/02/17 162 lb 12 oz (73.8 kg)  03/27/17 169 lb 4 oz (76.8 kg)   26.39 kg/m   Belching a lot lately- causing chest discomfort  Worried her  Chest was tight /pressure and burning  Was worried about circulation   No burning  No n/v  Loose bm at times (very mild)  No excess flatus  No blood in stool Dark stool baseline from iron   Takes nsaid perhaps once a month at most    High stress levels - work during pandemic (so much pressure)   No change in diet   EKG today  NSR Rate of 69 No acute changes   Patient Active Problem List   Diagnosis Date Noted  . Anemia 09/02/2017  . Chest discomfort 03/27/2017  . Routine general medical examination at a health care facility 06/19/2010   Past Medical History:  Diagnosis Date  . Anemia    Past Surgical History:  Procedure Laterality Date  . OVARIAN CYST SURGERY     Social History   Tobacco Use  . Smoking status: Never Smoker  . Smokeless tobacco: Never Used  Substance Use Topics  . Alcohol use: No    Alcohol/week: 0.0 standard drinks  . Drug use: No   Family History  Problem Relation Age of Onset  . Alcohol abuse Father   . Cancer Paternal Aunt        breast  . Heart disease Paternal Grandmother   . Cancer Paternal Grandfather        prostate CA   Allergies  Allergen Reactions  . Iron     GI side effect   Current Outpatient  Medications on File Prior to Visit  Medication Sig Dispense Refill  . iron polysaccharides (NIFEREX) 150 MG capsule Take 1 capsule (150 mg total) by mouth daily. With a meal 30 capsule 11  . naproxen (NAPROSYN) 500 MG tablet Take 1 tablet (500 mg total) by mouth 2 (two) times daily with a meal. (Patient taking differently: Take 500 mg by mouth as needed. ) 60 tablet 0  . PRENATAL VITAMINS PO Take one by mouth every other daily     No current facility-administered medications on file prior to visit.     Patient Active Problem List   Diagnosis Date Noted  . Anemia 09/02/2017  . Chest discomfort 03/27/2017  . Routine general medical examination at a health care facility 06/19/2010   Past Medical History:  Diagnosis Date  . Anemia    Past Surgical History:  Procedure Laterality Date  . OVARIAN CYST SURGERY     Social History   Tobacco Use  . Smoking status: Never Smoker  . Smokeless tobacco: Never Used  Substance Use Topics  . Alcohol use: No    Alcohol/week: 0.0 standard drinks  . Drug use: No   Family History  Problem Relation Age of Onset  .  Alcohol abuse Father   . Cancer Paternal Aunt        breast  . Heart disease Paternal Grandmother   . Cancer Paternal Grandfather        prostate CA   Allergies  Allergen Reactions  . Iron     GI side effect   Current Outpatient Medications on File Prior to Visit  Medication Sig Dispense Refill  . iron polysaccharides (NIFEREX) 150 MG capsule Take 1 capsule (150 mg total) by mouth daily. With a meal 30 capsule 11  . naproxen (NAPROSYN) 500 MG tablet Take 1 tablet (500 mg total) by mouth 2 (two) times daily with a meal. (Patient taking differently: Take 500 mg by mouth as needed. ) 60 tablet 0  . PRENATAL VITAMINS PO Take one by mouth every other daily     No current facility-administered medications on file prior to visit.     Review of Systems  Constitutional: Negative for activity change, appetite change, fatigue, fever  and unexpected weight change.  HENT: Negative for congestion, ear pain, rhinorrhea, sinus pressure and sore throat.   Eyes: Negative for pain, redness and visual disturbance.  Respiratory: Positive for chest tightness. Negative for cough, choking, shortness of breath, wheezing and stridor.   Cardiovascular: Negative for chest pain, palpitations and leg swelling.  Gastrointestinal: Negative for abdominal distention, abdominal pain, blood in stool, constipation, diarrhea, nausea, rectal pain and vomiting.       Discomfort in chest Excessive belching  Endocrine: Negative for polydipsia and polyuria.  Genitourinary: Negative for dysuria, frequency and urgency.  Musculoskeletal: Negative for arthralgias, back pain and myalgias.  Skin: Negative for pallor and rash.  Allergic/Immunologic: Negative for environmental allergies.  Neurological: Negative for dizziness, syncope and headaches.  Hematological: Negative for adenopathy. Does not bruise/bleed easily.  Psychiatric/Behavioral: Negative for decreased concentration and dysphoric mood. The patient is not nervous/anxious.        Objective:   Physical Exam Constitutional:      General: She is not in acute distress.    Appearance: Normal appearance. She is well-developed and normal weight. She is not ill-appearing or diaphoretic.  HENT:     Head: Normocephalic and atraumatic.     Mouth/Throat:     Mouth: Mucous membranes are moist.  Eyes:     General: No scleral icterus.    Conjunctiva/sclera: Conjunctivae normal.     Pupils: Pupils are equal, round, and reactive to light.  Cardiovascular:     Rate and Rhythm: Normal rate and regular rhythm.     Heart sounds: Normal heart sounds.  Pulmonary:     Effort: Pulmonary effort is normal. No respiratory distress.     Breath sounds: Normal breath sounds. No stridor. No wheezing or rales.     Comments: No chest wall tenderness Chest:     Chest wall: No tenderness.  Abdominal:     General:  Bowel sounds are normal. There is no distension.     Palpations: Abdomen is soft. There is no mass.     Tenderness: There is no abdominal tenderness. There is no guarding or rebound.     Hernia: No hernia is present.  Musculoskeletal:     Cervical back: Normal range of motion and neck supple.     Right lower leg: No edema.     Left lower leg: No edema.  Lymphadenopathy:     Cervical: No cervical adenopathy.  Skin:    General: Skin is warm and dry.     Coloration:  Skin is not pale.     Findings: No erythema or rash.  Neurological:     Mental Status: She is alert.     Cranial Nerves: No cranial nerve deficit.     Motor: No weakness.  Psychiatric:        Attention and Perception: Attention normal.        Mood and Affect: Mood is anxious.        Cognition and Memory: Cognition and memory normal.     Comments: Mildly anxious Pleasant and talkative            Assessment & Plan:   Problem List Items Addressed This Visit      Other   Chest discomfort - Primary    With excessive burping/belching and gas  Worsened by stress  Atypical/not exertional  EKG is reassuring-no changes  Discussed diet / avoid carbonation and spicy foods  Disc stressors and offered counseling ref in the future if needed  Px famotidine 40 mg to take one pill daily  Enc to update if symptoms worsen (if sudden-go to ER)  Update if not starting to improve in a week or if worsening   Handout re: GERd given       Relevant Orders   EKG 12-Lead (Completed)   Stress reaction    More stress at work and home with pandemic  This may be worsening chest discomfort and reflux Reviewed stressors/ coping techniques/symptoms/ support sources/ tx options and side effects in detail today  Offered counseling if desired in the future  Disc imp of self care

## 2019-03-04 NOTE — Patient Instructions (Addendum)
Keep your diet somewhat bland and eat slowly to reduce gas (also do not drink out of a straw or drink carbonation) Avoid spicy foods or other things that set you off   Try famotidine 40 mg once daily  Gas ex over the counter may also help bloating and gas and is safe    If worse or new symptoms let us know  If not significantly improved next week let us know   There are some meditation apps   (Calm , Headspace)  They are very helpful

## 2019-03-04 NOTE — Assessment & Plan Note (Signed)
More stress at work and home with pandemic  This may be worsening chest discomfort and reflux Reviewed stressors/ coping techniques/symptoms/ support sources/ tx options and side effects in detail today  Offered counseling if desired in the future  Disc imp of self care

## 2019-04-17 IMAGING — MG 2D DIGITAL DIAGNOSTIC UNILATERAL RIGHT MAMMOGRAM WITH CAD AND AD
6 series · 6 of 14 positions shown · non-contrast
Comparison: July 04, 2016 and earlier priors

CLINICAL DATA: 46-year-old patient recently had a negative
asymmetric fullness in the right axilla compared to the left. She
has had some tingling sensations in her arm intermittently. She has
had some lateral right breast pain, also intermittent. She reports
no breast pain today. She does not palpate a breast lump.

EXAM:
2D DIGITAL DIAGNOSTIC RIGHT MAMMOGRAM WITH CAD AND ADJUNCT TOMO
ULTRASOUND RIGHT BREAST

[R MLO synth-2D]
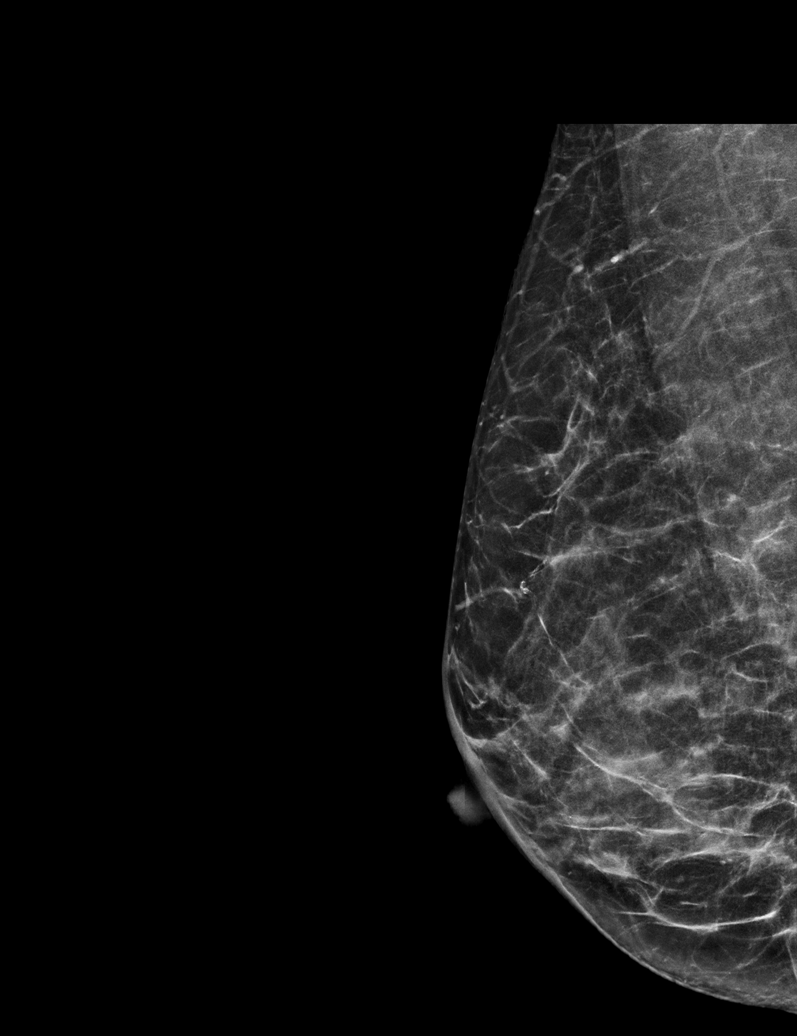

[R CC synth-2D]
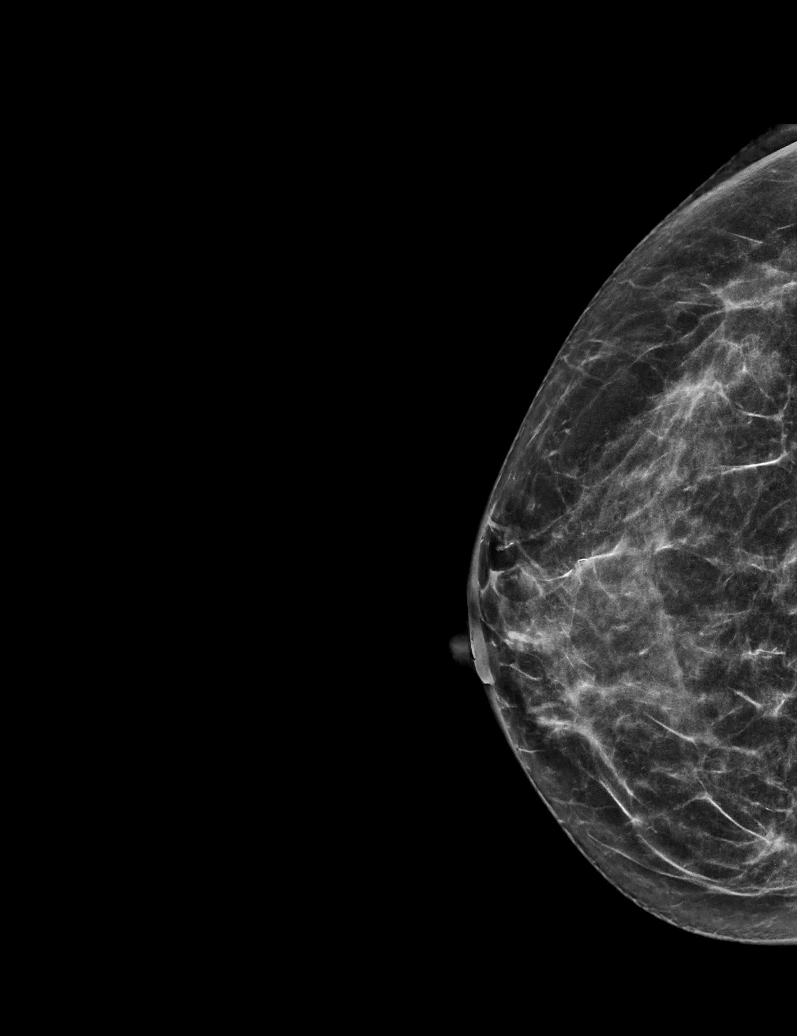

[R MLO]
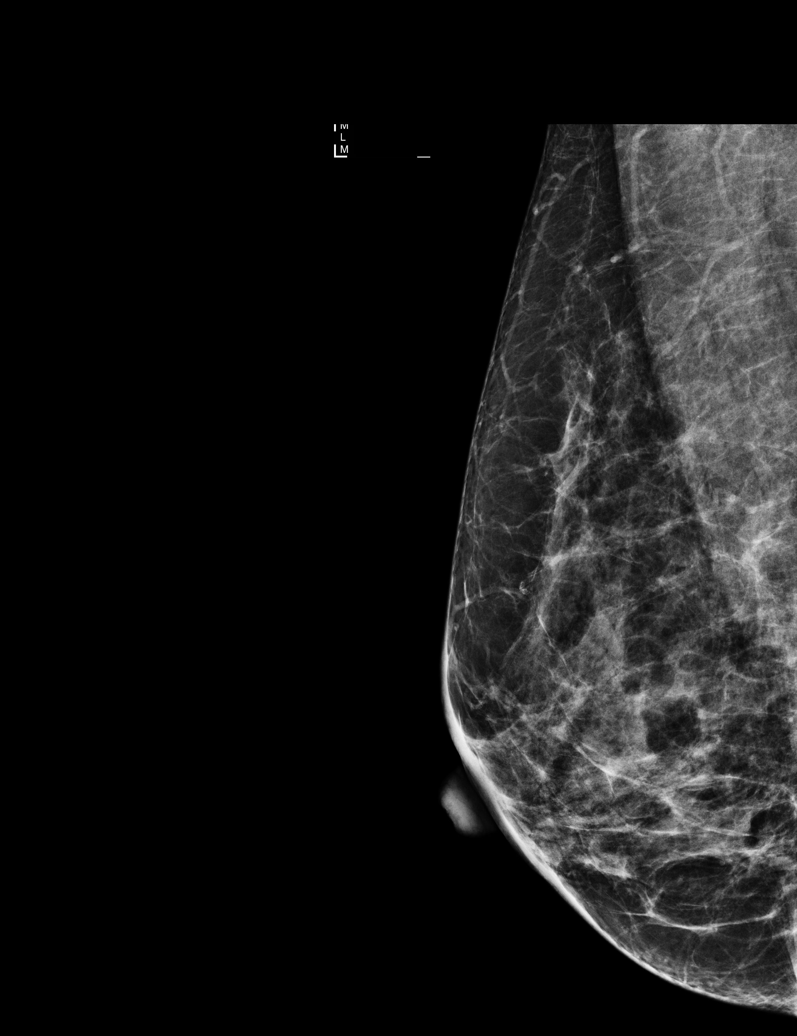

[R CC]
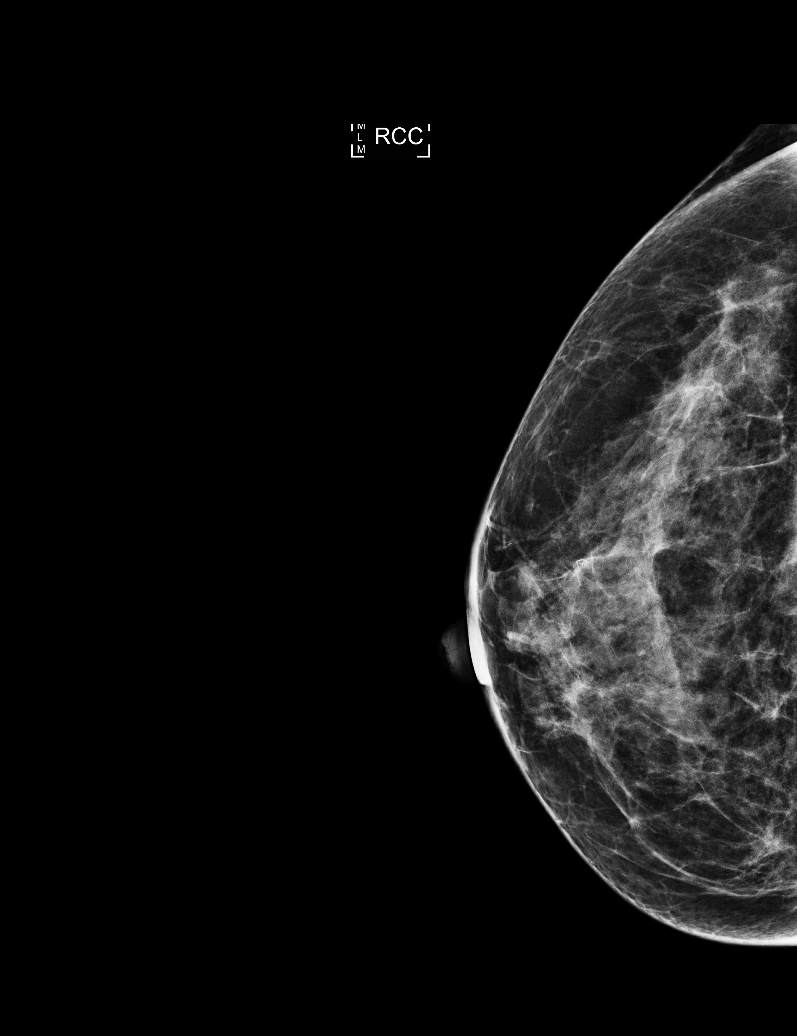

[R CC tomo · tomo slice 28/55.0]
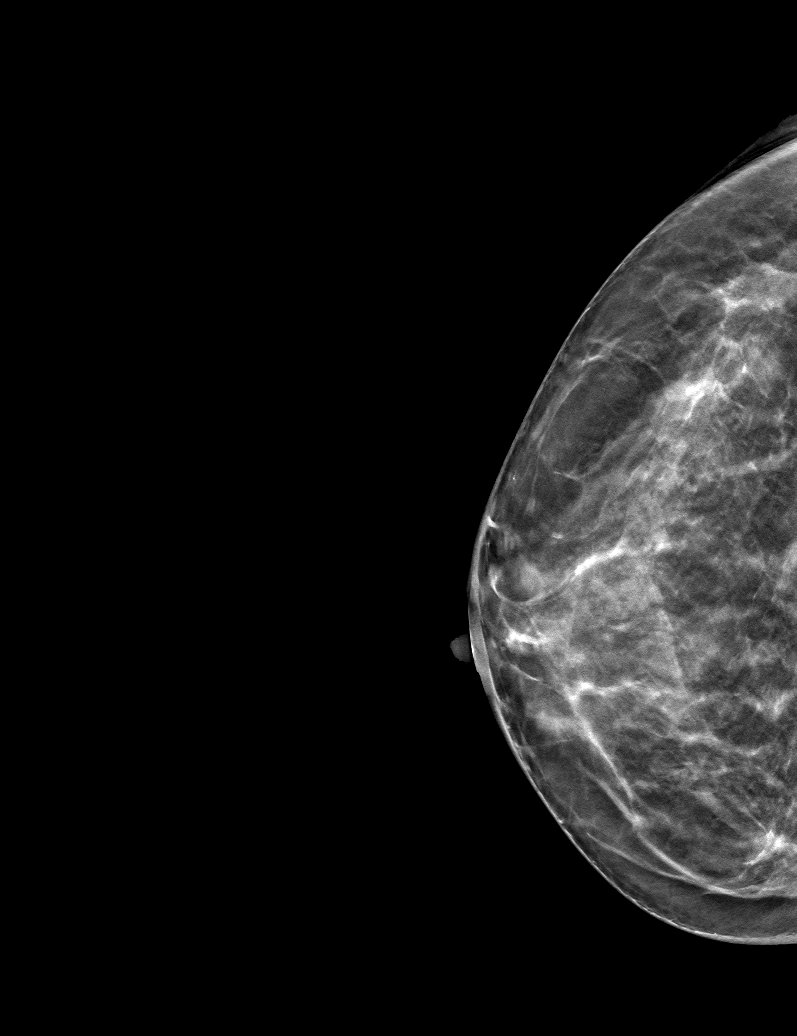

[R MLO tomo · tomo slice 27/53.0]
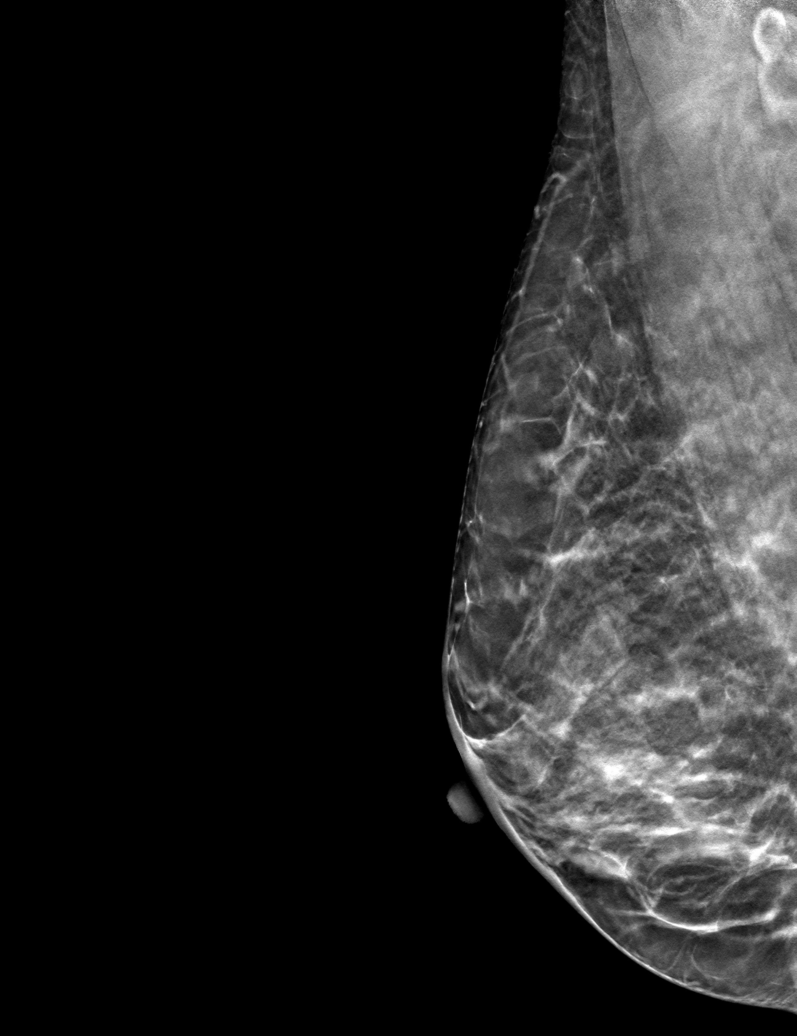

[6 of 14 positions shown; findings below may reference images not displayed]

ACR Breast Density Category c: The breast tissue is heterogeneously
dense, which may obscure small masses.
FINDINGS: The parenchymal pattern of the right breast is stable. No mass,
architectural distortion, or suspicious microcalcification is
identified. The imaged portion of the right axilla is normal.

Mammographic images were processed with CAD.

On physical exam, there is slight asymmetric fullness of the mid to
upper right axilla compared to the left on physical exam. On
physical examination, this feels like muscle. I do not palpate
lymphadenopathy. The skin appears normal.

Targeted ultrasound is performed, showing normal axillary contents
the region of patient concern corresponds to a band of muscle.
Normal right axillary lymph nodes are imaged. No edema, fluid
collection, or mass.
IMPRESSION: Normal sonographic appearance of the right axilla. Negative for
lymphadenopathy or mass. No evidence of malignancy in the right
breast.

RECOMMENDATION:
Screening mammogram in one year.(Code:6D-[id number])

I have discussed the findings and recommendations with the patient.
Results were also provided in writing at the conclusion of the
visit. If applicable, a reminder letter will be sent to the patient
regarding the next appointment.

BI-RADS CATEGORY  1: Negative.

## 2019-08-30 ENCOUNTER — Telehealth: Payer: Self-pay | Admitting: Family Medicine

## 2019-08-30 DIAGNOSIS — Z0000 Encounter for general adult medical examination without abnormal findings: Secondary | ICD-10-CM

## 2019-08-30 DIAGNOSIS — D500 Iron deficiency anemia secondary to blood loss (chronic): Secondary | ICD-10-CM

## 2019-08-30 NOTE — Telephone Encounter (Signed)
-----   Message from Ellamae Sia sent at 08/18/2019 11:35 AM EDT ----- Regarding: Lab orders for Tuesday, 7.27.21 Patient is scheduled for CPX labs, please order future labs, Thanks , Karna Christmas

## 2019-09-01 ENCOUNTER — Other Ambulatory Visit: Payer: Self-pay

## 2019-09-01 ENCOUNTER — Other Ambulatory Visit (INDEPENDENT_AMBULATORY_CARE_PROVIDER_SITE_OTHER): Payer: Managed Care, Other (non HMO)

## 2019-09-01 DIAGNOSIS — Z0000 Encounter for general adult medical examination without abnormal findings: Secondary | ICD-10-CM

## 2019-09-01 DIAGNOSIS — D500 Iron deficiency anemia secondary to blood loss (chronic): Secondary | ICD-10-CM

## 2019-09-01 LAB — COMPREHENSIVE METABOLIC PANEL
ALT: 11 U/L (ref 0–35)
AST: 12 U/L (ref 0–37)
Albumin: 3.9 g/dL (ref 3.5–5.2)
Alkaline Phosphatase: 79 U/L (ref 39–117)
BUN: 10 mg/dL (ref 6–23)
CO2: 25 mEq/L (ref 19–32)
Calcium: 8.8 mg/dL (ref 8.4–10.5)
Chloride: 107 mEq/L (ref 96–112)
Creatinine, Ser: 0.91 mg/dL (ref 0.40–1.20)
GFR: 79.42 mL/min (ref >60.00)
Glucose, Bld: 93 mg/dL (ref 70–99)
Potassium: 3.9 mEq/L (ref 3.5–5.1)
Sodium: 138 mEq/L (ref 135–145)
Total Bilirubin: 0.3 mg/dL (ref 0.2–1.2)
Total Protein: 6.6 g/dL (ref 6.0–8.3)

## 2019-09-01 LAB — CBC WITH DIFFERENTIAL/PLATELET
Basophils Absolute: 0.1 10*3/uL (ref 0.0–0.1)
Basophils Relative: 1.3 % (ref 0.0–3.0)
Eosinophils Absolute: 0.1 10*3/uL (ref 0.0–0.7)
Eosinophils Relative: 1.9 % (ref 0.0–5.0)
HCT: 31.9 % — ABNORMAL LOW (ref 36.0–46.0)
Hemoglobin: 10.5 g/dL — ABNORMAL LOW (ref 12.0–15.0)
Lymphocytes Relative: 31 % (ref 12.0–46.0)
Lymphs Abs: 2.1 10*3/uL (ref 0.7–4.0)
MCHC: 33 g/dL (ref 30.0–36.0)
MCV: 83.5 fl (ref 78.0–100.0)
Monocytes Absolute: 0.5 10*3/uL (ref 0.1–1.0)
Monocytes Relative: 7.6 % (ref 3.0–12.0)
Neutro Abs: 3.9 10*3/uL (ref 1.4–7.7)
Neutrophils Relative %: 58.2 % (ref 43.0–77.0)
Platelets: 248 10*3/uL (ref 150.0–400.0)
RBC: 3.81 Mil/uL — ABNORMAL LOW (ref 3.87–5.11)
RDW: 14.3 % (ref 11.5–15.5)
WBC: 6.6 10*3/uL (ref 4.0–10.5)

## 2019-09-01 LAB — LIPID PANEL
Cholesterol: 131 mg/dL (ref 0–200)
HDL: 56.7 mg/dL (ref >39.00)
LDL Cholesterol: 65 mg/dL (ref 0–99)
NonHDL: 74.41
Total CHOL/HDL Ratio: 2
Triglycerides: 48 mg/dL (ref 0.0–149.0)
VLDL: 9.6 mg/dL (ref 0.0–40.0)

## 2019-09-01 LAB — FERRITIN: Ferritin: 52.9 ng/mL (ref 10.0–291.0)

## 2019-09-01 LAB — TSH: TSH: 1.7 u[IU]/mL (ref 0.35–4.50)

## 2019-09-07 ENCOUNTER — Encounter: Payer: Self-pay | Admitting: Family Medicine

## 2019-09-07 ENCOUNTER — Ambulatory Visit (INDEPENDENT_AMBULATORY_CARE_PROVIDER_SITE_OTHER): Payer: Managed Care, Other (non HMO) | Admitting: Family Medicine

## 2019-09-07 ENCOUNTER — Other Ambulatory Visit: Payer: Self-pay

## 2019-09-07 VITALS — BP 116/76 | HR 75 | Temp 97.3°F | Ht 67.0 in | Wt 169.4 lb

## 2019-09-07 DIAGNOSIS — F430 Acute stress reaction: Secondary | ICD-10-CM

## 2019-09-07 DIAGNOSIS — D500 Iron deficiency anemia secondary to blood loss (chronic): Secondary | ICD-10-CM

## 2019-09-07 DIAGNOSIS — Z0000 Encounter for general adult medical examination without abnormal findings: Secondary | ICD-10-CM

## 2019-09-07 DIAGNOSIS — Z1211 Encounter for screening for malignant neoplasm of colon: Secondary | ICD-10-CM

## 2019-09-07 MED ORDER — POLYSACCHARIDE IRON COMPLEX 150 MG PO CAPS: 150.0000 mg | ORAL_CAPSULE | Freq: Every day | ORAL | 11 refills | Status: DC

## 2019-09-07 NOTE — Patient Instructions (Addendum)
Please send a copy of your pap from gyn office  Ask when you go , to send copies to me   Please call your ins co to see if /when they would cover a screening colonoscopy  Let us know   Get your flu shot in the fall   Take care of yourself  Keep exercising

## 2019-09-07 NOTE — Assessment & Plan Note (Signed)
Unchanged Also has heavy menses  Ferritin in low nl range Will continue niferex

## 2019-09-07 NOTE — Progress Notes (Signed)
Subjective:    Patient ID: Brooke Gill, female    DOB: Aug 19, 1970, 49 y.o.   MRN: [id number]  This visit occurred during the SARS-CoV-2 public health emergency.  Safety protocols were in place, including screening questions prior to the visit, additional usage of staff PPE, and extensive cleaning of exam room while observing appropriate contact time as indicated for disinfecting solutions.    HPI Here for health maintenance exam and to review chronic medical problems    Wt Readings from Last 3 Encounters:  09/07/19 169 lb 6 oz (76.8 kg)  03/04/19 163 lb 8 oz (74.2 kg)  09/05/18 162 lb 6 oz (73.7 kg)   26.53 kg/m  Working a lot Still high stress  Had to cancel trip to [place] due to pandemic  Taking care of herself  Walking for exercise and some zumba    Mammogram gets a mammogram in sept (had one in 2020) Self breast exam -no lumps or concerns (she did have to get a biopsy in 2018 and 2020)- dense tissue  Breast cancer in paternal aunt   Hep C screen  Had hiv screen neg in 08  Had pap in 2020 with her gyn  Menses- still coming regularly  Still heavy   Tdap 5/12 Flu vaccine - gets in the fall covid status- immunized   Would consider colonoscopy if covered   BP Readings from Last 3 Encounters:  09/07/19 116/76  03/04/19 102/76  09/05/18 104/69   Pulse Readings from Last 3 Encounters:  09/07/19 75  03/04/19 78  09/05/18 69     Past h/o anemia from heavy menses  niferex 150 mg daily  Lab Results  Component Value Date   WBC 6.6 09/01/2019   HGB 10.5 (L) 09/01/2019   HCT 31.9 (L) 09/01/2019   MCV 83.5 09/01/2019   PLT 248.0 09/01/2019   Lab Results  Component Value Date   FERRITIN 52.9 09/01/2019   No h/o sickle cell trait   Taking pepcid for acid reflux  Takes less often Stress and diet related symptoms   Cholesterol  Lab Results  Component Value Date   CHOL 131 09/01/2019   CHOL 130 09/02/2018   CHOL 128 08/30/2017   Lab Results   Component Value Date   HDL 56.70 09/01/2019   HDL 57.90 09/02/2018   HDL 53.50 08/30/2017   Lab Results  Component Value Date   LDLCALC 65 09/01/2019   LDLCALC 61 09/02/2018   LDLCALC 64 08/30/2017   Lab Results  Component Value Date   TRIG 48.0 09/01/2019   TRIG 53.0 09/02/2018   TRIG 54.0 08/30/2017   Lab Results  Component Value Date   CHOLHDL 2 09/01/2019   CHOLHDL 2 09/02/2018   CHOLHDL 2 08/30/2017   No results found for: LDLDIRECT  Excellent cholesterol  Diet not great   Lab Results  Component Value Date   CREATININE 0.91 09/01/2019   BUN 10 09/01/2019   NA 138 09/01/2019   K 3.9 09/01/2019   CL 107 09/01/2019   CO2 25 09/01/2019   Lab Results  Component Value Date   ALT 11 09/01/2019   AST 12 09/01/2019   ALKPHOS 79 09/01/2019   BILITOT 0.3 09/01/2019   Lab Results  Component Value Date   TSH 1.70 09/01/2019     Patient Active Problem List   Diagnosis Date Noted  . Colon cancer screening 09/07/2019  . Stress reaction 03/04/2019  . Anemia 09/02/2017  . Chest discomfort 03/27/2017  .  Routine general medical examination at a health care facility 06/19/2010   Past Medical History:  Diagnosis Date  . Anemia    Past Surgical History:  Procedure Laterality Date  . OVARIAN CYST SURGERY     Social History   Tobacco Use  . Smoking status: Never Smoker  . Smokeless tobacco: Never Used  Substance Use Topics  . Alcohol use: No    Alcohol/week: 0.0 standard drinks  . Drug use: No   Family History  Problem Relation Age of Onset  . Alcohol abuse Father   . Cancer Paternal Aunt        breast  . Heart disease Paternal Grandmother   . Cancer Paternal Grandfather        prostate CA   Allergies  Allergen Reactions  . Iron     GI side effect   Current Outpatient Medications on File Prior to Visit  Medication Sig Dispense Refill  . famotidine (PEPCID) 40 MG tablet Take 1 tablet (40 mg total) by mouth daily. 30 tablet 5  . naproxen  (NAPROSYN) 500 MG tablet Take 1 tablet (500 mg total) by mouth 2 (two) times daily with a meal. (Patient taking differently: Take 500 mg by mouth as needed. ) 60 tablet 0  . PRENATAL VITAMINS PO Take one by mouth every other daily     No current facility-administered medications on file prior to visit.    Review of Systems  Constitutional: Negative for activity change, appetite change, fatigue, fever and unexpected weight change.  HENT: Negative for congestion, ear pain, rhinorrhea, sinus pressure and sore throat.   Eyes: Negative for pain, redness and visual disturbance.  Respiratory: Negative for cough, shortness of breath and wheezing.   Cardiovascular: Negative for chest pain and palpitations.  Gastrointestinal: Negative for abdominal pain, blood in stool, constipation and diarrhea.  Endocrine: Negative for polydipsia and polyuria.  Genitourinary: Negative for dysuria, frequency and urgency.  Musculoskeletal: Negative for arthralgias, back pain and myalgias.  Skin: Negative for pallor and rash.  Allergic/Immunologic: Negative for environmental allergies.  Neurological: Negative for dizziness, syncope and headaches.  Hematological: Negative for adenopathy. Does not bruise/bleed easily.  Psychiatric/Behavioral: Negative for decreased concentration and dysphoric mood. The patient is not nervous/anxious.        Stressed       Objective:   Physical Exam Constitutional:      General: She is not in acute distress.    Appearance: Normal appearance. She is well-developed and normal weight. She is not ill-appearing or diaphoretic.  HENT:     Head: Normocephalic and atraumatic.     Right Ear: Tympanic membrane, ear canal and external ear normal.     Left Ear: Tympanic membrane, ear canal and external ear normal.     Nose: Nose normal. No congestion.     Mouth/Throat:     Mouth: Mucous membranes are moist.     Pharynx: Oropharynx is clear. No posterior oropharyngeal erythema.  Eyes:      General: No scleral icterus.    Extraocular Movements: Extraocular movements intact.     Conjunctiva/sclera: Conjunctivae normal.     Pupils: Pupils are equal, round, and reactive to light.  Neck:     Thyroid: No thyromegaly.     Vascular: No carotid bruit or JVD.  Cardiovascular:     Rate and Rhythm: Normal rate and regular rhythm.     Pulses: Normal pulses.     Heart sounds: Normal heart sounds. No gallop.   Pulmonary:  Effort: Pulmonary effort is normal. No respiratory distress.     Breath sounds: Normal breath sounds. No wheezing.     Comments: Good air exch Chest:     Chest wall: No tenderness.  Abdominal:     General: Bowel sounds are normal. There is no distension or abdominal bruit.     Palpations: Abdomen is soft. There is no mass.     Tenderness: There is no abdominal tenderness.     Hernia: No hernia is present.  Genitourinary:    Comments: Breast and pelvic exam done by gyn Musculoskeletal:        General: No tenderness. Normal range of motion.     Cervical back: Normal range of motion and neck supple. No rigidity. No muscular tenderness.     Right lower leg: No edema.     Left lower leg: No edema.  Lymphadenopathy:     Cervical: No cervical adenopathy.  Skin:    General: Skin is warm and dry.     Coloration: Skin is not pale.     Findings: No erythema or rash.  Neurological:     Mental Status: She is alert. Mental status is at baseline.     Cranial Nerves: No cranial nerve deficit.     Motor: No abnormal muscle tone.     Coordination: Coordination normal.     Gait: Gait normal.     Deep Tendon Reflexes: Reflexes are normal and symmetric. Reflexes normal.  Psychiatric:        Mood and Affect: Mood normal. Mood is not anxious or depressed.        Cognition and Memory: Cognition and memory normal.     Comments: Pleasant            Assessment & Plan:   Problem List Items Addressed This Visit      Other   Routine general medical examination at a  health care facility - Primary    Reviewed health habits including diet and exercise and skin cancer prevention Reviewed appropriate screening tests for age  Also reviewed health mt list, fam hx and immunization status , as well as social and family history   Has mammogram/pap at gyn office (sending for most recent) and has upcoming appt  Low risk for hep C Continues heavy menses Immunized for covid Plans to get a flu shot in the fall  Labs reviewed        Anemia    Unchanged Also has heavy menses  Ferritin in low nl range Will continue niferex      Relevant Medications   iron polysaccharides (NIFEREX) 150 MG capsule   Stress reaction    Stress is still high- dealing with it well      Colon cancer screening    Would like to pursue a colonoscopy  She will check with ins to see if they pay for it before or at 15 and call us

## 2019-09-07 NOTE — Assessment & Plan Note (Signed)
Would like to pursue a colonoscopy  She will check with ins to see if they pay for it before or at 33 and call us

## 2019-09-07 NOTE — Assessment & Plan Note (Signed)
Stress is still high- dealing with it well

## 2019-09-07 NOTE — Assessment & Plan Note (Signed)
Reviewed health habits including diet and exercise and skin cancer prevention Reviewed appropriate screening tests for age  Also reviewed health mt list, fam hx and immunization status , as well as social and family history   Has mammogram/pap at gyn office (sending for most recent) and has upcoming appt  Low risk for hep C Continues heavy menses Immunized for covid Plans to get a flu shot in the fall  Labs reviewed

## 2019-09-16 ENCOUNTER — Encounter (HOSPITAL_COMMUNITY): Payer: Self-pay | Admitting: Emergency Medicine

## 2019-09-16 ENCOUNTER — Other Ambulatory Visit: Payer: Self-pay

## 2019-09-16 ENCOUNTER — Emergency Department (HOSPITAL_COMMUNITY)
Admission: EM | Admit: 2019-09-16 | Discharge: 2019-09-16 | Disposition: A | Discharge status: Home / Self Care | Payer: Managed Care, Other (non HMO) | Attending: Emergency Medicine | Admitting: Emergency Medicine

## 2019-09-16 DIAGNOSIS — R000 Tachycardia, unspecified: Secondary | ICD-10-CM | POA: Insufficient documentation

## 2019-09-16 DIAGNOSIS — R42 Dizziness and giddiness: Secondary | ICD-10-CM | POA: Insufficient documentation

## 2019-09-16 DIAGNOSIS — Z5321 Procedure and treatment not carried out due to patient leaving prior to being seen by health care provider: Secondary | ICD-10-CM | POA: Diagnosis not present

## 2019-09-16 LAB — BASIC METABOLIC PANEL
Anion gap: 9 (ref 5–15)
BUN: 11 mg/dL (ref 6–20)
CO2: 24 mmol/L (ref 22–32)
Calcium: 8.6 mg/dL — ABNORMAL LOW (ref 8.9–10.3)
Chloride: 104 mmol/L (ref 98–111)
Creatinine, Ser: 0.88 mg/dL (ref 0.44–1.00)
GFR calc Af Amer: >60 mL/min (ref >60)
GFR calc non Af Amer: >60 mL/min (ref >60)
Glucose, Bld: 117 mg/dL — ABNORMAL HIGH (ref 70–99)
Potassium: 3.5 mmol/L (ref 3.5–5.1)
Sodium: 137 mmol/L (ref 135–145)

## 2019-09-16 LAB — I-STAT BETA HCG BLOOD, ED (MC, WL, AP ONLY): I-stat hCG, quantitative: <5 m[IU]/mL (ref <5)

## 2019-09-16 LAB — CBG MONITORING, ED: Glucose-Capillary: 99 mg/dL (ref 70–99)

## 2019-09-16 LAB — CBC
HCT: 35.4 % — ABNORMAL LOW (ref 36.0–46.0)
Hemoglobin: 11.3 g/dL — ABNORMAL LOW (ref 12.0–15.0)
MCH: 27.6 pg (ref 26.0–34.0)
MCHC: 31.9 g/dL (ref 30.0–36.0)
MCV: 86.3 fL (ref 80.0–100.0)
Platelets: 244 10*3/uL (ref 150–400)
RBC: 4.1 MIL/uL (ref 3.87–5.11)
RDW: 14.5 % (ref 11.5–15.5)
WBC: 7.1 10*3/uL (ref 4.0–10.5)
nRBC: 0 % (ref 0.0–0.2)

## 2019-09-16 NOTE — ED Notes (Signed)
Pt given urine cup for urine specimen.

## 2019-09-16 NOTE — ED Triage Notes (Signed)
Pt states that she was eating lunch and her Heart felt it was racing. Checked her HR on her watch and was 115. Felt dizzy. Reports her resting hear rate normally 70s. Pt felt dizzy as well.

## 2019-10-08 LAB — HM MAMMOGRAPHY: HM Mammogram: NORMAL (ref 0–4)

## 2019-10-08 LAB — HM PAP SMEAR

## 2019-11-02 ENCOUNTER — Encounter: Payer: Self-pay | Admitting: Family Medicine

## 2019-11-02 ENCOUNTER — Ambulatory Visit: Payer: Managed Care, Other (non HMO) | Admitting: Family Medicine

## 2019-11-02 ENCOUNTER — Other Ambulatory Visit: Payer: Self-pay

## 2019-11-02 ENCOUNTER — Ambulatory Visit (INDEPENDENT_AMBULATORY_CARE_PROVIDER_SITE_OTHER)
Admission: RE | Admit: 2019-11-02 | Discharge: 2019-11-02 | Disposition: A | Discharge status: Home / Self Care | Payer: Managed Care, Other (non HMO) | Source: Ambulatory Visit | Attending: Family Medicine | Admitting: Family Medicine

## 2019-11-02 VITALS — BP 90/70 | HR 65 | Temp 97.6°F | Ht 67.0 in | Wt 170.8 lb

## 2019-11-02 DIAGNOSIS — M7501 Adhesive capsulitis of right shoulder: Secondary | ICD-10-CM

## 2019-11-02 DIAGNOSIS — M25511 Pain in right shoulder: Secondary | ICD-10-CM

## 2019-11-02 NOTE — Progress Notes (Signed)
Hassaan Crite T. Daphne Karrer, MD, [place] [facility] [place], [address]  Phone: [phone]  FAX: [phone]  Brooke Gill - 49 y.o. female  MRN [id number]  Date of Birth: December 12, 1970  Date: 11/02/2019  PCP: Abner Greenspan, MD  Referral: Abner Greenspan, MD  Chief Complaint  Patient presents with  . Shoulder Pain    Right    This visit occurred during the SARS-CoV-2 public health emergency.  Safety protocols were in place, including screening questions prior to the visit, additional usage of staff PPE, and extensive cleaning of exam room while observing appropriate contact time as indicated for disinfecting solutions.   Subjective:   Brooke Gill is a 49 y.o. very pleasant female patient who presents with the following: shoulder pain  The patient noted above presents with shoulder pain that has been ongoing for 4 months there is no history of trauma or accident. The patient denies neck pain or radicular symptoms. No shoulder blade pain Denies dislocation, subluxation, separation of the shoulder. The patient does complain of pain with flexion, abduction, and terminal motion.  Significant restriction of motion. she describes a deep ache around the shoulder, and sometimes it will wake the patient up at night.  r shoulder pain, likely frozen shoulder.  Loss of rom, some tingling.  Showed up out the blue.   Significant Loss of motion, diffuse  Medications Tried: NSAIDs and Tylenol Ice or Heat: minimal help Tried PT: No  Prior shoulder Injury: No Prior surgery: No Prior fracture: No   Review of Systems is noted in the HPI, as appropriate  Objective:   Blood pressure 90/70, pulse 65, temperature 97.6 F (36.4 C), temperature source Temporal, height 5\' 7"  (1.702 m), weight 170 lb 12 oz (77.5 kg), last menstrual period 10/12/2019, SpO2 100 %.   GEN: No acute distress;  alert,appropriate. PULM: Breathing comfortably in no respiratory distress PSYCH: Normally interactive.   Shoulder: R and L Inspection: No muscle wasting or winging Ecchymosis/edema: neg  AC joint, scapula, clavicle: NT Cervical spine: NT, full ROM Spurling's: neg ABNORMAL SIDE TESTED: R UNLESS OTHERWISE NOTED, THE CONTRALATERAL SIDE HAS FULL RANGE OF MOTION. Abduction: 5/5, LIMITED TO 140 DEGREES Flexion: 5/5, LIMITED TO 140 DEGNO ROM  IR, lift-off: 5/5. TESTED AT 90 DEGREES OF ABDUCTION, LIMITED TO 0 DEGREES ER at neutral:  5/5, TESTED AT 90 DEGREES OF ABDUCTION, LIMITED TO 90 DEGREES, but the L has remarkable ROM AC crossover and compression: PAIN Drop Test: neg Empty Can: neg Supraspinatus insertion: NT Bicipital groove: NT ALL OTHER SPECIAL TESTING EQUIVOCAL GIVEN LOSS OF MOTION C5-T1 intact Sensation intact Grip 5/5   Assessment and Plan:     ICD-10-CM   1. Acute pain of right shoulder  M25.511 DG Shoulder Right  2. Adhesive capsulitis of right shoulder  M75.01 DG Shoulder Right   Total encounter time: 30 minutes. This includes total time spent on the day of encounter. Per below, anatomy and chart review. Face to face XR review. HEP.  Patient was given a systematic ROM protocol from [facility] to be done daily. Emphasized importance of adherence, help of PT, daily HEP.  No significant GH OA.  The average length of total symptoms is 12-18 months going through 3 different phases in the freezing and thawing process. Reviewed all with patient.   Tylenol or NSAID of choice prn for pain relief Intraarticular shoulder injections discussed with patient,  which have good evidence for accelerating the thawing phase.  Patient will be sent for formal PT for aggressive frozen shoulder ROM if sx persist. Will need RTC str and scapular stabilization to fix underlying mechanics.  I appreciate the opportunity to evaluate this very friendly patient. If you have any question regarding her  care or prognosis, do not hesitate to ask.  Follow-up: 2 mo   There are no discontinued medications. Orders Placed This Encounter  Procedures  . DG Shoulder Right    Signed,  Frederico Hamman T. Guliana Weyandt, MD   Outpatient Encounter Medications as of 11/02/2019  Medication Sig  . famotidine (PEPCID) 40 MG tablet Take 1 tablet (40 mg total) by mouth daily.  . iron polysaccharides (NIFEREX) 150 MG capsule Take 1 capsule (150 mg total) by mouth daily. With a meal  . naproxen (NAPROSYN) 500 MG tablet Take 1 tablet (500 mg total) by mouth 2 (two) times daily with a meal. (Patient taking differently: Take 500 mg by mouth as needed. )  . PRENATAL VITAMINS PO Take one by mouth every other daily   No facility-administered encounter medications on file as of 11/02/2019.

## 2019-12-22 ENCOUNTER — Encounter: Payer: Self-pay | Admitting: Orthopaedic Surgery

## 2019-12-22 ENCOUNTER — Ambulatory Visit: Payer: Managed Care, Other (non HMO) | Admitting: Orthopaedic Surgery

## 2019-12-22 DIAGNOSIS — M7501 Adhesive capsulitis of right shoulder: Secondary | ICD-10-CM | POA: Insufficient documentation

## 2019-12-22 NOTE — Progress Notes (Signed)
Office Visit Note   Patient: Brooke Gill           Date of Birth: 21-Mar-1970           MRN: [id number] Visit Date: 12/22/2019              Requested by: Tower, Wynelle Fanny, MD [place],  [place] [address] PCP: Abner Greenspan, MD   Assessment & Plan: Visit Diagnoses: No diagnosis found.  Plan: We will proceed with MRI scan right shoulder.  We discussed treatment options if no significant pathology is present in her shoulder including injection, manipulation with injection etc.  She has had symptoms not responding to anti-inflammatories and had over a month of home stretching program with persistent pain and decreased range of motion.  We will proceed with MRI scan to rule out labral tear or partial rotator cuff tearing.  Office follow-up after scan for review.  Follow-Up Instructions: Follow-up after MRI scan right shoulder.  Orders:  No orders of the defined types were placed in this encounter.  No orders of the defined types were placed in this encounter.     Procedures: No procedures performed   Clinical Data: No additional findings.   Subjective: Chief Complaint  Patient presents with  . Right Shoulder - Pain    HPI 49 year old female with 38-month history of dominant right shoulder pain decreased range of motion in abduction or flexion only to 80 degrees.  Patient recalls remotely many months for that falling when she was rollerskating but does not note that she had any pain or problems.  Patient's not been able to wash her hair she has to go to beauty store to get her hair done.  Difficulty getting dressed.  She is not able to put her hair in a ponytail.  She describes deep aching in her shoulder she denies neck pain no numbness or tingling in her hand.  She was given exercise sheets but states that she has tried to do some of this when she was seen back in September and she states she was diagnosed by the sports medicine primary care doc as having a  frozen shoulder.  She denies fever or chills.  Review of Systems patient's had no breast biopsy x2 which were negative.  She does have some stomach trouble takes Pepcid.  All other systems are negative is obtains HPI.   Objective: Vital Signs: BP 123/90   Pulse 71   Ht 5' 6.5" (1.689 m)   Wt 165 lb (74.8 kg)   BMI 26.23 kg/m   Physical Exam Constitutional:      Appearance: She is well-developed.  HENT:     Head: Normocephalic.     Right Ear: External ear normal.     Left Ear: External ear normal.  Eyes:     Pupils: Pupils are equal, round, and reactive to light.  Neck:     Thyroid: No thyromegaly.     Trachea: No tracheal deviation.  Cardiovascular:     Rate and Rhythm: Normal rate.  Pulmonary:     Effort: Pulmonary effort is normal.  Abdominal:     Palpations: Abdomen is soft.  Skin:    General: Skin is warm and dry.  Neurological:     Mental Status: She is alert and oriented to person, place, and time.  Psychiatric:        Behavior: Behavior normal.     Ortho Exam patient has abduction only to 80  degrees flexion to 80 to 90 degrees with significant pain and resistance.  Limited internal and external rotation with pain.  She is tender over the biceps tendon anterior shoulder joint posterior shoulder joint.  Negative Spurling no brachial plexus tenderness.  Upper extremity reflexes are 2+.  Negative impingement and full range of motion opposite left shoulder.  Specialty Comments:  No specialty comments available.  Imaging: Previous x-ray images are reviewed from 11/02/2019.  They were read as very mild before meals and glenohumeral degenerative changes.   PMFS History: Patient Active Problem List   Diagnosis Date Noted  . Colon cancer screening 09/07/2019  . Stress reaction 03/04/2019  . Anemia 09/02/2017  . Chest discomfort 03/27/2017  . Routine general medical examination at a health care facility 06/19/2010   Past Medical History:  Diagnosis Date  .  Anemia     Family History  Problem Relation Age of Onset  . Alcohol abuse Father   . Cancer Paternal Aunt        breast  . Heart disease Paternal Grandmother   . Cancer Paternal Grandfather        prostate CA    Past Surgical History:  Procedure Laterality Date  . OVARIAN CYST SURGERY     Social History   Occupational History  . Not on file  Tobacco Use  . Smoking status: Never Smoker  . Smokeless tobacco: Never Used  Substance and Sexual Activity  . Alcohol use: No    Alcohol/week: 0.0 standard drinks  . Drug use: No  . Sexual activity: Not on file

## 2019-12-22 NOTE — Addendum Note (Signed)
Addended by: Meyer Cory on: 12/22/2019 03:01 PM   Modules accepted: Orders

## 2019-12-23 ENCOUNTER — Telehealth: Payer: Self-pay | Admitting: Orthopaedic Surgery

## 2019-12-23 NOTE — Telephone Encounter (Signed)
Called patient left message to return call to schedule an MRI review appointment with Dr. Lorin Mercy   MRI is scheduled 01/03/2020

## 2020-01-03 ENCOUNTER — Ambulatory Visit
Admission: RE | Admit: 2020-01-03 | Discharge: 2020-01-03 | Disposition: A | Discharge status: Home / Self Care | Payer: Managed Care, Other (non HMO) | Source: Ambulatory Visit | Attending: Orthopaedic Surgery | Admitting: Orthopaedic Surgery

## 2020-01-03 ENCOUNTER — Other Ambulatory Visit: Payer: Self-pay

## 2020-01-03 DIAGNOSIS — M7501 Adhesive capsulitis of right shoulder: Secondary | ICD-10-CM

## 2020-01-05 ENCOUNTER — Ambulatory Visit: Payer: Managed Care, Other (non HMO) | Admitting: Orthopaedic Surgery

## 2020-01-05 ENCOUNTER — Other Ambulatory Visit: Payer: Self-pay

## 2020-01-05 ENCOUNTER — Encounter: Payer: Self-pay | Admitting: Orthopaedic Surgery

## 2020-01-05 VITALS — Ht 66.5 in | Wt 165.0 lb

## 2020-01-05 DIAGNOSIS — M7501 Adhesive capsulitis of right shoulder: Secondary | ICD-10-CM | POA: Diagnosis not present

## 2020-01-05 DIAGNOSIS — M7541 Impingement syndrome of right shoulder: Secondary | ICD-10-CM

## 2020-01-06 ENCOUNTER — Ambulatory Visit: Payer: Managed Care, Other (non HMO) | Admitting: Family Medicine

## 2020-01-06 ENCOUNTER — Encounter: Payer: Self-pay | Admitting: Family Medicine

## 2020-01-06 VITALS — BP 122/70 | HR 80 | Temp 97.7°F | Ht 66.5 in | Wt 169.4 lb

## 2020-01-06 DIAGNOSIS — M7541 Impingement syndrome of right shoulder: Secondary | ICD-10-CM | POA: Diagnosis not present

## 2020-01-06 DIAGNOSIS — R002 Palpitations: Secondary | ICD-10-CM

## 2020-01-06 DIAGNOSIS — R000 Tachycardia, unspecified: Secondary | ICD-10-CM | POA: Diagnosis not present

## 2020-01-06 DIAGNOSIS — M7501 Adhesive capsulitis of right shoulder: Secondary | ICD-10-CM

## 2020-01-06 MED ORDER — LIDOCAINE HCL 1 % IJ SOLN
0.5000 mL | INTRAMUSCULAR | Status: AC | PRN
  Administered 2020-01-06: .5 mL

## 2020-01-06 MED ORDER — METHYLPREDNISOLONE ACETATE 40 MG/ML IJ SUSP
40.0000 mg | INTRAMUSCULAR | Status: AC | PRN
  Administered 2020-01-06: 40 mg via INTRA_ARTICULAR

## 2020-01-06 MED ORDER — BUPIVACAINE HCL 0.25 % IJ SOLN
2.0000 mL | INTRAMUSCULAR | Status: AC | PRN
  Administered 2020-01-06: 2 mL via INTRA_ARTICULAR

## 2020-01-06 MED ORDER — BUPIVACAINE HCL 0.25 % IJ SOLN
4.0000 mL | INTRAMUSCULAR | Status: AC | PRN
  Administered 2020-01-06: 4 mL via INTRA_ARTICULAR

## 2020-01-06 NOTE — Progress Notes (Signed)
Subjective:    Patient ID: Brooke Gill, female    DOB: [birth year], 49 y.o.   MRN: [id number]  This visit occurred during the SARS-CoV-2 public health emergency.  Safety protocols were in place, including screening questions prior to the visit, additional usage of staff PPE, and extensive cleaning of exam room while observing appropriate contact time as indicated for disinfecting solutions.    HPI  Pt presents with tachycardia  Wt Readings from Last 3 Encounters:  01/06/20 169 lb 6 oz (76.8 kg)  01/05/20 165 lb (74.8 kg)  12/22/19 165 lb (74.8 kg)   26.93 kg/m   BP Readings from Last 3 Encounters:  01/06/20 122/70  12/22/19 123/90  11/02/19 90/70    Pulse Readings from Last 3 Encounters:  01/06/20 80  12/22/19 71  11/02/19 65     She went to the ER 09/16/19 for inc HR and dizziness but left before being seen Was eating lunch at work and her HR went up to 125 mg (checked at work and some fluttering)   Lab Results  Component Value Date   CREATININE 0.88 09/16/2019   BUN 11 09/16/2019   NA 137 09/16/2019   K 3.5 09/16/2019   CL 104 09/16/2019   CO2 24 09/16/2019   Lab Results  Component Value Date   WBC 7.1 09/16/2019   HGB 11.3 (L) 09/16/2019   HCT 35.4 (L) 09/16/2019   MCV 86.3 09/16/2019   PLT 244 09/16/2019  glucose was 117   EKG showed rate of 104 at that time with ? Borderline repolarization abn (some ST/ T changes) Today rate of 87 with no acute changes   Lab Results  Component Value Date   TSH 1.70 09/01/2019     H/o anemia from heavy menses Lab Results  Component Value Date   FERRITIN 52.9 09/01/2019  takes niferex 150 mg daily   She periodically feels fluttering during the day - fleeting- less than 15 seconds  Stopped wearing the fitness watch- was watching too closely   Caffeine- rarely drinks diet dr pepper -at most 3 per week Some chocolate  Drinks lots of water  Exercise- not regular  No chest pressure  Exercise tolerance is  down/not active     Stress level is high (8 out of 10 )  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 Impingement syndrome of right shoulder 01/06/2020  . Palpitations 01/06/2020  . 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 Colon cancer screening 09/07/2019  . Stress reaction 03/04/2019  . Anemia 09/02/2017  . Chest discomfort 03/27/2017  . Routine general medical examination at a health care facility 06/19/2010   Past Medical History:  Diagnosis Date  . Anemia    Past Surgical History:  Procedure Laterality Date  . OVARIAN CYST SURGERY     Social History   Tobacco Use  . Smoking status: Never Smoker  . Smokeless tobacco: Never Used  Substance Use Topics  . Alcohol use: No    Alcohol/week: 0.0 standard drinks  . Drug use: No   Family  History  Problem Relation Age of Onset  . Alcohol abuse Father   . Cancer Paternal Aunt        breast  . Heart disease Paternal Grandmother   . Cancer Paternal Grandfather        prostate CA   Allergies  Allergen Reactions  . Iron     GI side effect   Current Outpatient Medications on File Prior to Visit  Medication Sig Dispense Refill  . famotidine (PEPCID) 40 MG tablet Take 1 tablet (40 mg total) by mouth daily. 30 tablet 5  . iron polysaccharides (NIFEREX) 150 MG capsule Take 1 capsule (150 mg total) by mouth daily. With a meal 30 capsule 11  . naproxen (NAPROSYN) 500 MG tablet Take 1 tablet (500 mg total) by mouth 2 (two) times daily with a meal. (Patient taking differently: Take 500 mg by mouth as needed. ) 60 tablet 0  . PRENATAL VITAMINS PO Take one by mouth every other daily     No current  facility-administered medications on file prior to visit.     Review of Systems  Constitutional: Negative for activity change, appetite change, fatigue, fever and unexpected weight change.  HENT: Negative for congestion, ear pain, rhinorrhea, sinus pressure and sore throat.   Eyes: Negative for pain, redness and visual disturbance.  Respiratory: Negative for cough, shortness of breath and wheezing.   Cardiovascular: Positive for palpitations. Negative for chest pain and leg swelling.  Gastrointestinal: Negative for abdominal pain, blood in stool, constipation and diarrhea.  Endocrine: Negative for polydipsia and polyuria.  Genitourinary: Negative for dysuria, frequency and urgency.  Musculoskeletal: Negative for arthralgias, back pain and myalgias.  Skin: Negative for pallor and rash.  Allergic/Immunologic: Negative for environmental allergies.  Neurological: Negative for dizziness, syncope and headaches.  Hematological: Negative for adenopathy. Does not bruise/bleed easily.  Psychiatric/Behavioral: Negative for decreased concentration and dysphoric mood. The patient is not nervous/anxious.        Stressors        Objective:   Physical Exam Constitutional:      General: She is not in acute distress.    Appearance: Normal appearance. She is well-developed and normal weight. She is not ill-appearing.  HENT:     Head: Normocephalic and atraumatic.  Eyes:     Conjunctiva/sclera: Conjunctivae normal.     Pupils: Pupils are equal, round, and reactive to light.  Neck:     Thyroid: No thyromegaly.     Vascular: No carotid bruit or JVD.  Cardiovascular:     Rate and Rhythm: Normal rate and regular rhythm.     Heart sounds: Normal heart sounds. No gallop.   Pulmonary:     Effort: Pulmonary effort is normal. No respiratory distress.     Breath sounds: Normal breath sounds. No wheezing or rales.  Abdominal:     General: Bowel sounds are normal. There is no distension or abdominal bruit.      Palpations: Abdomen is soft.  Musculoskeletal:     Cervical back: Normal range of motion and neck supple.     Right lower leg: No edema.  Lymphadenopathy:     Cervical: No cervical adenopathy.  Skin:    General: Skin is warm and dry.     Findings: No rash.  Neurological:     Mental Status: She is alert.     Motor: No weakness.     Coordination: Coordination normal.     Deep Tendon Reflexes: Reflexes are normal and symmetric. Reflexes normal.  Psychiatric:        Mood and Affect: Mood normal.     Comments: Candidly discusses stressors  Not anxious            Assessment & Plan:   Problem List Items Addressed This Visit      Other   Palpitations - Primary    Fleeting episodes of rapid HR since august and becoming more frequent (once caught on her smart watch at rate of 125 bpm) Disc caffeine cessation  Disc stressors as a trigger  Nl EKG and rev w/u in august w/o cause found Ref made to cardiology for further eval/tx -likely for a monitor  Disc pt fam hx of young CAD as well      Relevant Orders   Ambulatory referral to Cardiology    Other Visit Diagnoses    Tachycardia       Relevant Orders   EKG 12-Lead (Completed)   Ambulatory referral to Cardiology

## 2020-01-06 NOTE — Patient Instructions (Signed)
I placed a cardiology referral for palpitations  The office will call to set this up   Avoid caffeine  If symptoms suddenly worsen (or an episode does not stop) go to the ER and let us know   Take care of yourself

## 2020-01-06 NOTE — Progress Notes (Signed)
Office Visit Note   Patient: Brooke Gill           Date of Birth: Nov 15, 1970           MRN: [id number] Visit Date: 01/05/2020              Requested by: Tower, Wynelle Fanny, MD [place],  [place] [address] PCP: Abner Greenspan, MD   Assessment & Plan: Visit Diagnoses:  1. Adhesive capsulitis of right shoulder   2. Impingement syndrome of right shoulder     Plan: We discussed recommendations and proceed with subacromial injection for the subacromial bursitis noted on MRI scan.  Then proceeded to posterior glenohumeral injection for the adhesive capsulitis.  She did not notice any increase in active range of motion and attempts in the supine position to gently abduct her shoulder improved range of motion picked up maybe 5 degrees in flexion and abduction which was limited by pain.  We will set her up for physical therapy.  Unfortunately with her 66-month history is adhesive capsulitis hip therapy is not making any progress over few weeks then outpatient arthroscopy lysis of adhesions and manipulation would be recommended due to painful frozen shoulder symptoms.  I plan to check her back again in 4 weeks.  I gave her a copy of her MRI report we reviewed her images at today's visit and discussed pathophysiology.  Follow-Up Instructions: Return in about 4 weeks (around 02/02/2020).   Orders:  Orders Placed This Encounter  Procedures  . Large Joint Inj: R subacromial bursa  . Large Joint Inj: R glenohumeral  . Ambulatory referral to Physical Therapy   No orders of the defined types were placed in this encounter.     Procedures: Large Joint Inj: R subacromial bursa on 01/06/2020 7:58 AM Indications: pain Details: 22 G 1.5 in needle  Arthrogram: No  Medications: 40 mg methylPREDNISolone acetate 40 MG/ML; 0.5 mL lidocaine 1 %; 2 mL bupivacaine 0.25 % Outcome: tolerated well, no immediate complications Procedure, treatment alternatives, risks and benefits explained,  specific risks discussed. Consent was given by the patient. Immediately prior to procedure a time out was called to verify the correct patient, procedure, equipment, support staff and site/side marked as required. Patient was prepped and draped in the usual sterile fashion.   Large Joint Inj: R glenohumeral on 01/06/2020 7:59 AM Indications: pain Details: 22 G 1.5 in needle  Arthrogram: No  Medications: 4 mL bupivacaine 0.25 %; 40 mg methylPREDNISolone acetate 40 MG/ML; 0.5 mL lidocaine 1 % Outcome: tolerated well, no immediate complications Procedure, treatment alternatives, risks and benefits explained, specific risks discussed. Consent was given by the patient. Immediately prior to procedure a time out was called to verify the correct patient, procedure, equipment, support staff and site/side marked as required. Patient was prepped and draped in the usual sterile fashion.       Clinical Data: No additional findings.   Subjective: Chief Complaint  Patient presents with  . Right Shoulder - Follow-up    MRI review    HPI 49 year old female returns with 5 months history of low right shoulder adhesive capsulitis.  MRI scan has been obtained which shows 25% supraspinatus tear on the bursal surface.  No atrophy biceps tendon is intact.  She did have thickening inferior glenohumeral ligament with pericapsular edema consistent with adhesive capsulitis.  Review of Systems 14 point system update unchanged from 12/22/2019 office visit other than above.   Objective: Vital Signs: Ht  5' 6.5" (1.689 m)   Wt 165 lb (74.8 kg)   BMI 26.23 kg/m   Physical Exam Constitutional:      Appearance: She is well-developed.  HENT:     Head: Normocephalic.     Right Ear: External ear normal.     Left Ear: External ear normal.  Eyes:     Pupils: Pupils are equal, round, and reactive to light.  Neck:     Thyroid: No thyromegaly.     Trachea: No tracheal deviation.  Cardiovascular:     Rate and  Rhythm: Normal rate.  Pulmonary:     Effort: Pulmonary effort is normal.  Abdominal:     Palpations: Abdomen is soft.  Skin:    General: Skin is warm and dry.  Neurological:     Mental Status: She is alert and oriented to person, place, and time.  Psychiatric:        Behavior: Behavior normal.     Ortho Exam patient has abduction only to 60 degrees flexion to 60 with severe pain right shoulder.  Left arm she can get easily overhead with no problems.  She is noted some asymmetry in the trapezial or suprascapular region but no palpable lymph nodes are noted.  Sensation of the hand is intact.  Long head of the biceps is stable.  She has 20 degrees external rotation with sharp pain.  Full elbow range of motion.  Specialty Comments:  No specialty comments available.  Imaging: CLINICAL DATA:  Right shoulder pain with decreased range of motion  EXAM: MRI OF THE RIGHT SHOULDER WITHOUT CONTRAST  TECHNIQUE: Multiplanar, multisequence MR imaging of the shoulder was performed. No intravenous contrast was administered.  COMPARISON:  X-ray 11/02/2019  FINDINGS: Rotator cuff: Supraspinatus tendinosis with partial-thickness bursal sided fraying/tearing anteriorly involving up to 25% of the tendon depth (series 4, images 11-12). The infraspinatus, subscapularis, and teres minor tendons are intact.  Muscles: Preserved bulk and signal intensity of the rotator cuff musculature without edema, atrophy, or fatty infiltration.  Biceps long head: Intact without tendinosis or tear. Small volume tenosynovial fluid.  Acromioclavicular Joint: Mild arthropathy of the acromioclavicular joint. Small volume subacromial-subdeltoid bursal fluid.  Glenohumeral Joint: No joint effusion. No chondral defect.  Labrum: Grossly intact, but evaluation is limited by lack of intraarticular fluid.  Bones:  No marrow abnormality, fracture or dislocation.  Other: Thickening of the inferior  glenohumeral ligament with pericapsular edema.  IMPRESSION: 1. Supraspinatus tendinosis with partial-thickness bursal sided fraying/tearing anteriorly involving up to 25% of the tendon depth. 2. Findings suggestive of adhesive capsulitis. 3. Mild subacromial-subdeltoid bursitis.   Electronically Signed   By: Davina Poke D.O.   On: 01/03/2020 17:05    PMFS History: Patient Active Problem List   Diagnosis Date Noted  . Impingement syndrome of right shoulder 01/06/2020  . 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 Colon cancer screening 09/07/2019  . Stress reaction 03/04/2019  . Anemia 09/02/2017  . Chest discomfort 03/27/2017  . Routine general medical examination at a health care facility 06/19/2010   Past Medical History:  Diagnosis Date  . Anemia     Family History  Problem Relation Age of Onset  . Alcohol abuse Father   . Cancer Paternal Aunt        breast  . Heart disease Paternal Grandmother   . Cancer Paternal Grandfather        prostate CA    Past Surgical History:  Procedure Laterality Date  . OVARIAN CYST  SURGERY     Social History   Occupational History  . Not on file  Tobacco Use  . Smoking status: Never Smoker  . Smokeless tobacco: Never Used  Substance and Sexual Activity  . Alcohol use: No    Alcohol/week: 0.0 standard drinks  . Drug use: No  . Sexual activity: Not on file

## 2020-01-06 NOTE — Assessment & Plan Note (Signed)
Fleeting episodes of rapid HR since august and becoming more frequent (once caught on her smart watch at rate of 125 bpm) Disc caffeine cessation  Disc stressors as a trigger  Nl EKG and rev w/u in august w/o cause found Ref made to cardiology for further eval/tx -likely for a monitor  Disc pt fam hx of young CAD as well

## 2020-01-15 ENCOUNTER — Telehealth: Payer: Self-pay | Admitting: Radiology

## 2020-01-15 ENCOUNTER — Other Ambulatory Visit: Payer: Self-pay

## 2020-01-15 ENCOUNTER — Encounter: Payer: Self-pay | Admitting: Internal Medicine

## 2020-01-15 ENCOUNTER — Ambulatory Visit: Payer: Managed Care, Other (non HMO) | Admitting: Internal Medicine

## 2020-01-15 VITALS — BP 112/70 | HR 84 | Resp 98 | Ht 66.5 in | Wt 169.6 lb

## 2020-01-15 DIAGNOSIS — R002 Palpitations: Secondary | ICD-10-CM

## 2020-01-15 NOTE — Telephone Encounter (Signed)
Enrolled patient for a 14 day Zio XT  monitor to be mailed to patients home  °

## 2020-01-15 NOTE — Progress Notes (Signed)
Cardiology Office Note:    Date:  01/15/2020   ID:  Brooke Gill, DOB Jun 17, 1970, MRN [id number] (Pronounced my'-ur)  PCP:  Abner Greenspan, MD  [facility] Cardiologist:  No primary care provider on file.  [facility] Electrophysiologist:  None   CC: Palpitations Consulted for the evaluation of palpitations at the behest of Gill, Brooke Fanny, MD   History of Present Illness:    Brooke Gill is a 49 y.o. female with a hx of palpitations and IDA presents for evaluation.  Patient notes that she is feeling intermittent palpitations.  Patient notes that she has been having flutters in her heart.  These occur spontaneously and resolve within 5-10 minutes.  One episode did not and caused her significant anxiety:  Went to ED; HR went down to 100, and resolved.   Has had no chest pain, chest pressure, chest tightness, chest stinging.  Was checking her Apple watch but this increase her anxiety without finding clear etiology of her palpitations.  Has some DOE with ~ 20 stairs or walking to her car; no shortness of breath, DOE.  No PND or orthopnea. Bending over causes dizziness. No significant weight gain, leg swelling , or abdominal swelling.  No syncope or near syncope .  Patient reports NO prior cardiac testing including  echo,  stress test,  heart catheterizations,  cardioversion,  ablations.  Past Medical History:  Diagnosis Date  . Anemia     Past Surgical History:  Procedure Laterality Date  . OVARIAN CYST SURGERY      Current Medications: Current Meds  Medication Sig  . famotidine (PEPCID) 40 MG tablet Take 1 tablet (40 mg total) by mouth daily.  . iron polysaccharides (NIFEREX) 150 MG capsule Take 1 capsule (150 mg total) by mouth daily. With a meal  . naproxen (NAPROSYN) 500 MG tablet Take 1 tablet (500 mg total) by mouth 2 (two) times daily with a meal.  . PRENATAL VITAMINS PO Take one by mouth every other daily     Allergies:   Iron   Social History   Socioeconomic  History  . Marital status: Married    Spouse name: Not on file  . Number of children: Not on file  . Years of education: Not on file  . Highest education level: Not on file  Occupational History  . Not on file  Tobacco Use  . Smoking status: Never Smoker  . Smokeless tobacco: Never Used  Substance and Sexual Activity  . Alcohol use: No    Alcohol/week: 0.0 standard drinks  . Drug use: No  . Sexual activity: Not on file  Other Topics Concern  . Not on file  Social History Narrative  . Not on file   Social Determinants of Health   Financial Resource Strain: Not on file  Food Insecurity: Not on file  Transportation Needs: Not on file  Physical Activity: Not on file  Stress: Not on file  Social Connections: Not on file     Family History: The patient's family history includes Alcohol abuse in her father; Cancer in her paternal aunt and paternal grandfather; Heart disease in her paternal grandmother.  History of coronary artery disease notable for uncle, grandfather and grandmother. History of heart failure notable for no members but is unsure. No history of cardiomyopathies including hypertrophic cardiomyopathy, left ventricular non-compaction, or arrhythmogenic right ventricular cardiomyopathy. History of arrhythmia notable for no members. Denies family history of sudden cardiac death including drowning, car accidents, or unexplained  deaths in the family. No history of bicuspid aortic valve or aortic aneurysm or dissection.   ROS:   Please see the history of present illness.    All other systems reviewed and are negative.  EKGs/Labs/Other Studies Reviewed:    The following studies were reviewed today:  EKG:  EKG is ordered today.  The ekg ordered today demonstrates  SR rate 84 01/06/20:  SR 71 WNL Recent Labs: 09/01/2019: ALT 11; TSH 1.70 09/16/2019: BUN 11; Creatinine, Ser 0.88; Hemoglobin 11.3; Platelets 244; Potassium 3.5; Sodium 137  Recent Lipid Panel     Component Value Date/Time   CHOL 131 09/01/2019 0737   TRIG 48.0 09/01/2019 0737   HDL 56.70 09/01/2019 0737   CHOLHDL 2 09/01/2019 0737   VLDL 9.6 09/01/2019 0737   LDLCALC 65 09/01/2019 0737   Risk Assessment/Calculations:     N/A  Physical Exam:    VS:  BP 112/70   Pulse 84   Resp (!) 98   Ht 5' 6.5" (1.689 m)   Wt 169 lb 9.6 oz (76.9 kg)   LMP 12/21/2019   BMI 26.96 kg/m     Wt Readings from Last 3 Encounters:  01/15/20 169 lb 9.6 oz (76.9 kg)  01/06/20 169 lb 6 oz (76.8 kg)  01/05/20 165 lb (74.8 kg)    GEN:  Well nourished, well developed in no acute distress HEENT: Normal NECK: No JVD; No carotid bruits LYMPHATICS: No lymphadenopathy CARDIAC: RRR, no murmurs, rubs, gallops RESPIRATORY:  Clear to auscultation without rales, wheezing or rhonchi  ABDOMEN: Soft, non-tender, non-distended MUSCULOSKELETAL:  No edema; No deformity  SKIN: Warm and dry NEUROLOGIC:  Alert and oriented x 3 PSYCHIATRIC:  Normal affect   ASSESSMENT:    1. Palpitations    PLAN:    In order of problems listed above:  Palpitations; - will obtain 14-day non live heart monitor (ZioPatch)  Dyspnea - offered BNP and CPET; patient will defer at this time and if at follow up [place] has worsened, will get further evaluation   3 month follow up unless new symptoms or abnormal test results warranting change in plan  Would be reasonable for  Virtual Follow up  Would be reasonable for  APP Follow up   Medication Adjustments/Labs and Tests Ordered: Current medicines are reviewed at length with the patient today.  Concerns regarding medicines are outlined above.  Orders Placed This Encounter  Procedures  . LONG TERM MONITOR (3-14 DAYS)   No orders of the defined types were placed in this encounter.   Patient Instructions  Medication Instructions:  Your physician recommends that you continue on your current medications as directed. Please refer to the Current Medication list given to  you today.  *If you need a refill on your cardiac medications before your next appointment, please call your pharmacy*   Lab Work: none If you have labs (blood work) drawn today and your tests are completely normal, you will receive your results only by: Marland Kitchen MyChart Message (if you have MyChart) OR . A paper copy in the mail If you have any lab test that is abnormal or we need to change your treatment, we will call you to review the results.   Testing/Procedures: ZIO Monitor/ Patch  Your physician has requested you wear a ZIO patch monitor for 14 days.  This is a single patch monitor. [employer] supplies one patch monitor per enrollment. Additional stickers are not available.  Please do not apply patch if you will be having  a Nuclear Stress Test, Echocardiogram, Cardiac CT, MRI, or Chest Xray during the time frame you would be wearing the monitor. The patch cannot be worn during these tests. You cannot remove and re-apply the ZIO AT patch monitor.   Your ZIO patch monitor will be sent Fed Ex from [employer] directly to your home address. The monitor may also be mailed to a PO BOX if home delivery is not available. It may take 3-5 days to receive your monitor after you have been enrolled.  Once you have received you monitor, please review enclosed instructions. Your monitor has already been registered assigning a specific monitor serial # to you.   Applying the monitor  Shave hair from upper left chest.  Hold abrader disc by orange tab. Rub abrader in 40 strokes over left upper chest as indicated in your monitor instructions.  Clean area with 4 enclosed alcohol pads. Use all pads to ensure the area is cleaned thoroughly. Let dry.  Apply patch as indicated in monitor instructions. Patch will be placed under collarbone on left side of chest with arrow pointing upward.  Rub patch adhesive wings for 2 minutes. Remove the white label marked "1". Remove the white label marked "2". Rub patch  adhesive wings for 2 additional minutes.  While looking in a mirror, press and release button in center of patch. A small green light will flash 3-4 times. This will be your only indicator the monitor has been turned on.  Do not shower for the first 24 hours. You may shower after the first 24 hours.  Press the button if you feel a symptom. You will hear a small click. Record Date, Time and Symptom in the Patient Log.    Returning your monitor  Remove your patch and place it inside the [place]. In the lower half of the [place] there is a white bag with prepaid postage on it. Place Gateway in bag and seal. Mail package back to [place] as soon as possible. Your physician should have your final report approximately 7 days after you have mailed back your monitor.   Call [place] at [phone] if you have questions regarding your ZIO AT patch monitor. Call them immediately if you see an orange light blinking on your monitor.  If your monitor falls off in less than 4 days contact our Monitor department at [phone]. If your monitor becomes loose or falls off after 4 days call [employer] at [phone] for suggestions on securing your monitor.      Follow-Up: At [facility], you and your health needs are our priority.  As part of our continuing mission to provide you with exceptional heart care, we have created designated Provider Care Teams.  These Care Teams include your primary Cardiologist (physician) and Advanced Practice Providers (APPs -  Physician Assistants and Nurse Practitioners) who all work together to provide you with the care you need, when you need it.  We recommend signing up for the patient portal called "MyChart".  Sign up information is provided on this After Visit Summary.  MyChart is used to connect with patients for Virtual Visits (Telemedicine).  Patients are able to view lab/test results, encounter notes, upcoming appointments, etc.  Non-urgent  messages can be sent to your provider as well.   To learn more about what you can do with MyChart, go to [email or web address].    Your next appointment:   1 year(s)  The format for your next appointment:   In Person  Provider:   Rudean Haskell, MD   Other Instructions      Signed, Werner Lean, MD  01/15/2020 5:14 PM    [place]

## 2020-01-15 NOTE — Patient Instructions (Signed)
Medication Instructions:  Your physician recommends that you continue on your current medications as directed. Please refer to the Current Medication list given to you today.  *If you need a refill on your cardiac medications before your next appointment, please call your pharmacy*   Lab Work: none If you have labs (blood work) drawn today and your tests are completely normal, you will receive your results only by: Marland Kitchen MyChart Message (if you have MyChart) OR . A paper copy in the mail If you have any lab test that is abnormal or we need to change your treatment, we will call you to review the results.   Testing/Procedures: ZIO Monitor/ Patch  Your physician has requested you wear a ZIO patch monitor for 14 days.  This is a single patch monitor. Ir[employer] supplies one patch monitor per enrollment. Additional stickers are not available.  Please do not apply patch if you will be having a Nuclear Stress Test, Echocardiogram, Cardiac CT, MRI, or Chest Xray during the time frame you would be wearing the monitor. The patch cannot be worn during these tests. You cannot remove and re-apply the ZIO AT patch monitor.   Your ZIO patch monitor will be sent Fed Ex from [employer] directly to your home address. The monitor may also be mailed to a PO BOX if home delivery is not available. It may take 3-5 days to receive your monitor after you have been enrolled.  Once you have received you monitor, please review enclosed instructions. Your monitor has already been registered assigning a specific monitor serial # to you.   Applying the monitor  Shave hair from upper left chest.  Hold abrader disc by orange tab. Rub abrader in 40 strokes over left upper chest as indicated in your monitor instructions.  Clean area with 4 enclosed alcohol pads. Use all pads to ensure the area is cleaned thoroughly. Let dry.  Apply patch as indicated in monitor instructions. Patch will be placed under collarbone on left  side of chest with arrow pointing upward.  Rub patch adhesive wings for 2 minutes. Remove the white label marked "1". Remove the white label marked "2". Rub patch adhesive wings for 2 additional minutes.  While looking in a mirror, press and release button in center of patch. A small green light will flash 3-4 times. This will be your only indicator the monitor has been turned on.  Do not shower for the first 24 hours. You may shower after the first 24 hours.  Press the button if you feel a symptom. You will hear a small click. Record Date, Time and Symptom in the Patient Log.    Returning your monitor  Remove your patch and place it inside the [place]. In the lower half of the [place] there is a white bag with prepaid postage on it. Place [place] in bag and seal. Mail package back to [place] as soon as possible. Your physician should have your final report approximately 7 days after you have mailed back your monitor.   Call [place] at [phone] if you have questions regarding your ZIO AT patch monitor. Call them immediately if you see an orange light blinking on your monitor.  If your monitor falls off in less than 4 days contact our Monitor department at [phone]. If your monitor becomes loose or falls off after 4 days call [employer] at [phone] for suggestions on securing your monitor.      Follow-Up: At [facility], you and your  health needs are our priority.  As part of our continuing mission to provide you with exceptional heart care, we have created designated Provider Care Teams.  These Care Teams include your primary Cardiologist (physician) and Advanced Practice Providers (APPs -  Physician Assistants and Nurse Practitioners) who all work together to provide you with the care you need, when you need it.  We recommend signing up for the patient portal called "MyChart".  Sign up information is provided on this After Visit Summary.  MyChart is used  to connect with patients for Virtual Visits (Telemedicine).  Patients are able to view lab/test results, encounter notes, upcoming appointments, etc.  Non-urgent messages can be sent to your provider as well.   To learn more about what you can do with MyChart, go to [email or web address].    Your next appointment:   1 year(s)  The format for your next appointment:   In Person  Provider:   Rudean Haskell, MD   Other Instructions

## 2020-01-19 ENCOUNTER — Ambulatory Visit (INDEPENDENT_AMBULATORY_CARE_PROVIDER_SITE_OTHER): Payer: Managed Care, Other (non HMO)

## 2020-01-19 DIAGNOSIS — R002 Palpitations: Secondary | ICD-10-CM | POA: Diagnosis not present

## 2020-01-19 NOTE — Addendum Note (Signed)
Addended by: Georgiann Cocker on: 01/19/2020 08:41 AM   Modules accepted: Orders

## 2020-01-27 ENCOUNTER — Ambulatory Visit: Payer: Managed Care, Other (non HMO)

## 2020-02-15 ENCOUNTER — Telehealth: Payer: Self-pay

## 2020-02-15 ENCOUNTER — Other Ambulatory Visit: Payer: Self-pay

## 2020-02-15 ENCOUNTER — Ambulatory Visit: Payer: Managed Care, Other (non HMO) | Attending: Orthopaedic Surgery

## 2020-02-15 DIAGNOSIS — I493 Ventricular premature depolarization: Secondary | ICD-10-CM

## 2020-02-15 DIAGNOSIS — M25611 Stiffness of right shoulder, not elsewhere classified: Secondary | ICD-10-CM | POA: Insufficient documentation

## 2020-02-15 DIAGNOSIS — M25511 Pain in right shoulder: Secondary | ICD-10-CM | POA: Insufficient documentation

## 2020-02-15 DIAGNOSIS — M6281 Muscle weakness (generalized): Secondary | ICD-10-CM | POA: Insufficient documentation

## 2020-02-15 MED ORDER — METOPROLOL TARTRATE 25 MG PO TABS
12.5000 mg | ORAL_TABLET | Freq: Two times a day (BID) | ORAL | 3 refills | Status: DC
Start: 2020-02-15 — End: 2020-09-07

## 2020-02-15 NOTE — Telephone Encounter (Signed)
-----   Message from Werner Lean, MD sent at 02/14/2020  9:49 AM EST ----- Results: Symptomatic PVCs Plan: Will trial metoprolol 12.5 mg PO BID and will get echocardiogram  Werner Lean, MD

## 2020-02-15 NOTE — Telephone Encounter (Signed)
Pt reviewed MDs recommendation via MyChart. Metoprolol tartrate 12.5mg  bid sent into pts preferred pharmacy. Echo ordered.

## 2020-02-15 NOTE — Therapy (Signed)
[place], [place], [address] Phone: [phone]   Fax:  [phone]  Physical Therapy Evaluation  Patient Details  Name: Brooke Gill MRN: [id number] Date of Birth: 08/26/1970 Referring Provider (PT): Marybelle Killings, MD   Encounter Date: 02/15/2020   PT End of Session - 02/15/20 0733    Visit Number 1    Number of Visits 13    Date for PT Re-Evaluation 04/02/20    Authorization Type [employer]    PT Start Time 0720    PT Stop Time 0757    PT Time Calculation (min) 37 min    Activity Tolerance Patient tolerated treatment well    Behavior During Therapy [facility] for tasks assessed/performed           Past Medical History:  Diagnosis Date  . Anemia     Past Surgical History:  Procedure Laterality Date  . OVARIAN CYST SURGERY      There were no vitals filed for this visit.    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 Mostly wants to "work out the frozen shoulder." She feels the shoulder movement and pain have decreased since receiving cortisone injection in December, but her motion is still limited and the pain increases with movement. She was recommended by physician to begin PT prior to considering surgical intervention. She reports pain can increase to an 8/10 when she goes to move her shoulder too much described as dull ache across the top of the shoulder.    Limitations Lifting;House hold activities    Patient Stated Goals "Hopefully I can get full range of motion back, at least raise my arm over my head." wants to be able to wash her back and put her hair up    Currently in Pain? No/denies    Aggravating Factors  moving it    Pain Relieving Factors not moving it              [facility] PT Assessment - 02/15/20 0001      Assessment   Medical  Diagnosis Adhesive capsulitis of right shoulder    Referring Provider (PT) Marybelle Killings, MD    Onset Date/Surgical Date --   August 2021   Hand Dominance Left    Next MD Visit after PT concludes    Prior Therapy none      Precautions   Precautions None      Restrictions   Weight Bearing Restrictions No      Balance Screen   Has the patient fallen in the past 6 months Yes   patient had fall while roller skating in August 2021 and believes this is how she tore the rotator cuff     [place] residence    Living Arrangements Spouse/significant other      Prior Function   Level of Independence Independent    Vocation --   scientist     Cognition   Overall Cognitive Status Within Functional Limits for tasks assessed      Observation/Other Assessments   Observations forward head, rounded shoulders    Focus on Therapeutic Outcomes (FOTO)  53% function; 70% predicted      Sensation   Light Touch Appears Intact      AROM   Overall AROM  Comments pain with all AROM Rt; Full and pain free Lt shoulder AROM    AROM Assessment Site Shoulder    Right/Left Shoulder Right    Right Shoulder Flexion 100 Degrees    Right Shoulder ABduction 90 Degrees    Right Shoulder Internal Rotation 25 Degrees    Right Shoulder External Rotation 25 Degrees      PROM   Overall PROM Comments empty end feel    PROM Assessment Site Shoulder    Right/Left Shoulder Right    Right Shoulder Flexion 115 Degrees    Right Shoulder ABduction 80 Degrees    Right Shoulder Internal Rotation 30 Degrees    Right Shoulder External Rotation 30 Degrees      Strength   Overall Strength Comments Lt shoulder strength gross 4+/5    Strength Assessment Site Shoulder    Right/Left Shoulder Right    Right Shoulder Flexion 3-/5    Right Shoulder ABduction 3-/5    Right Shoulder Internal Rotation 4/5    Right Shoulder External Rotation 3+/5                       Objective measurements completed on examination: See above findings.       [facility] Adult PT Treatment/Exercise - 02/15/20 0001      Self-Care   Self-Care Other Self-Care Comments    Other Self-Care Comments  see patient education                  PT Education - 02/15/20 [phone]    Education Details Education on current condition, POC, and HEP. Education on FOTO results and predicted outcome    Person(s) Educated Patient    Methods Explanation;Demonstration;Verbal cues;Handout    Comprehension Verbalized understanding;Returned demonstration            PT Short Term Goals - 02/15/20 0859      PT SHORT TERM GOAL #1   Title Patient will be independent and compliant with established HEP.    Baseline issued at eval.    Time 3    Period Weeks    Status New    Target Date 03/07/20      PT SHORT TERM GOAL #2   Title Patient will verbalize understanding of measures to assist in pain control.    Baseline Education on use of heat/ice to assist in pain/soreness.    Time 3    Period Weeks    Status New    Target Date 03/07/20             PT Long Term Goals - 02/15/20 0900      PT LONG TERM GOAL #1   Title Patient will report pain as </= 3/10 in Rt shoulder to improve quality of life.    Baseline 8/10 at worst    Time 6    Period Weeks    Status New    Target Date 03/28/20      PT LONG TERM GOAL #2   Title Patient will score at least 70% function on FOTO to signify clinically meaningful improvement in functional abilities.    Baseline 53%    Time 6    Period Weeks    Status New    Target Date 03/28/20      PT LONG TERM GOAL #3   Title Patient will demonstrate functional Rt shoulder AROM (120 flexion and abduction, 45 ER) to improve ability to complete reaching and self-care activities.  Baseline see flowsheet    Time 6    Period Weeks    Status New    Target Date 03/28/20      PT LONG TERM GOAL #4   Title Patient will  demonstrate at least 4/5 strength in Rt shoulder to improve ability to complete lifting activity.    Baseline see flowsheet    Time 6    Period Weeks    Status New    Target Date 03/28/20      PT LONG TERM GOAL #5   Title Patient will be independent with advanced HEP to maintain/progress current functional abilities.    Time 6    Period Weeks    Status New    Target Date 03/28/20                  Plan - 02/15/20 0949    Clinical Impression Statement Patient is a 50 y/o L-handed female with signs and symptoms consistent with adhesive capsulitis of the Rt shoulder that began after patient sustained a fall while roller skating in August 2021. She currently presents with limited and painful Rt Shoulder A/PROM, Rt shoulder weakness, postural dysfunction, and activity limitations secondary to pain and limited mobility. She will benefit from skilled PT to address above stated deficits in order to return to optimal function.    Personal Factors and Comorbidities Time since onset of injury/illness/exacerbation    Examination-Activity Limitations Bathing;Carry;Dressing;Hygiene/Grooming;Lift;Reach Overhead;Sleep    Examination-Participation Restrictions Cleaning;Community Activity;Driving;Laundry;Occupation    Stability/Clinical Decision Making Stable/Uncomplicated    Clinical Decision Making Low    Rehab Potential Good    PT Frequency 2x / week    PT Duration 6 weeks    PT Treatment/Interventions ADLs/Self Care Home Management;Cryotherapy;Moist Heat;Electrical Stimulation;Functional mobility training;Therapeutic activities;Therapeutic exercise;Neuromuscular re-education;Patient/family education;Manual techniques;Passive range of motion;Dry needling;Taping;Vasopneumatic Device    PT Next Visit Plan Review HEP. Manual therapy (PROM, joint mobilizations). Consider Pulleys, finger ladder. rotator cuff isometrics    PT Home Exercise Plan Access Code [email or web address]    Consulted and Agree with Plan of  Care Patient           Patient will benefit from skilled therapeutic intervention in order to improve the following deficits and impairments:  Decreased range of motion,Decreased endurance,Impaired UE functional use,Pain,Hypomobility,Impaired flexibility,Decreased mobility,Decreased strength,Postural dysfunction  Visit Diagnosis: Right shoulder pain, unspecified chronicity  Stiffness of right shoulder, not elsewhere classified  Muscle weakness (generalized)     Problem List Patient Active Problem List   Diagnosis Date Noted  . Impingement syndrome of right shoulder 01/06/2020  . Palpitations 01/06/2020  . 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 Colon cancer screening 09/07/2019  . Stress reaction 03/04/2019  . Anemia 09/02/2017  . Chest discomfort 03/27/2017  . Routine general medical examination at a health care facility 06/19/2010   Gwendolyn Grant, PT, DPT, ATC 02/15/20 10:03 AM  [facility] [place] [address] [place], [place], [address] Phone: [phone]   Fax:  [phone]  Name: Brooke Gill MRN: [id number] Date of Birth: 10-Dec-1970

## 2020-03-02 ENCOUNTER — Ambulatory Visit: Payer: Managed Care, Other (non HMO) | Admitting: Physical Therapy

## 2020-03-02 ENCOUNTER — Encounter: Payer: Self-pay | Admitting: Physical Therapy

## 2020-03-02 ENCOUNTER — Other Ambulatory Visit: Payer: Self-pay

## 2020-03-02 DIAGNOSIS — M25511 Pain in right shoulder: Secondary | ICD-10-CM

## 2020-03-02 DIAGNOSIS — M25611 Stiffness of right shoulder, not elsewhere classified: Secondary | ICD-10-CM

## 2020-03-02 DIAGNOSIS — M6281 Muscle weakness (generalized): Secondary | ICD-10-CM

## 2020-03-02 NOTE — Therapy (Signed)
[place], [place], [address] Phone: [phone]   Fax:  [phone]  Physical Therapy Treatment  Patient Details  Name: Brooke Gill MRN: [id number] Date of Birth: [birth year] Referring Provider (PT): Marybelle Killings, MD   Encounter Date: 03/02/2020   PT End of Session - 03/02/20 0833    Visit Number 2    Number of Visits 13    Date for PT Re-Evaluation 04/02/20    Authorization Type [employer]    PT Start Time 0800    PT Stop Time 0841    PT Time Calculation (min) 41 min    Activity Tolerance Patient tolerated treatment well    Behavior During Therapy [facility] for tasks assessed/performed           Past Medical History:  Diagnosis Date  . Anemia     Past Surgical History:  Procedure Laterality Date  . OVARIAN CYST SURGERY      There were no vitals filed for this visit.   Subjective Assessment - 03/02/20 0803    Subjective "I am trying to do the exercises, I think I am getting more mobility. There are still some restriction due to pain."    Patient Stated Goals "Hopefully I can get full range of motion back, at least raise my arm over my head." wants to be able to wash her back and put her hair up    Currently in Pain? Yes    Pain Score 0-No pain   with activity at worst 8/10   Pain Location Shoulder    Pain Orientation Right    Pain Descriptors / Indicators Aching;Sore    Pain Onset More than a month ago    Pain Frequency Intermittent    Aggravating Factors  moving it and activity    Pain Relieving Factors resting and avoiding movement              [facility] PT Assessment - 03/02/20 0001      Assessment   Medical Diagnosis Adhesive capsulitis of right shoulder    Referring Provider (PT) Marybelle Killings, MD      AROM   Right Shoulder Flexion 119 Degrees    Right Shoulder ABduction 105 Degrees                         [facility] Adult PT Treatment/Exercise - 03/02/20 0001       Exercises   Exercises Shoulder      Shoulder Exercises: Seated   Other Seated Exercises self shoulder ER with arm propped on pillow with trunk flexion 1 x 10 holding 3-5 seconds      Shoulder Exercises: Standing   Other Standing Exercises standing shoulder IR with towel reaching across the back 1 x 10 holding 3-5 seconds      Shoulder Exercises: Pulleys   Flexion 2 minutes    Scaption 2 minutes      Shoulder Exercises: Stretch   Other Shoulder Stretches upper trap stretch 1 x 30      Manual Therapy   Manual Therapy Joint mobilization;Soft tissue mobilization;Passive ROM    Joint Mobilization GHJ PA/AP, and inferior grade III    Passive ROM shoulder flexion/ abduction with gential distraction ossiclation for pain progress ROM intermittently PRN                  PT Education - 03/02/20 2774    Education Details reviewed HEP and updated  today for working on [employer]) Educated Patient    Methods Explanation;Verbal cues;Handout    Comprehension Verbalized understanding;Verbal cues required            PT Short Term Goals - 02/15/20 0859      PT SHORT TERM GOAL #1   Title Patient will be independent and compliant with established HEP.    Baseline issued at eval.    Time 3    Period Weeks    Status New    Target Date 03/07/20      PT SHORT TERM GOAL #2   Title Patient will verbalize understanding of measures to assist in pain control.    Baseline Education on use of heat/ice to assist in pain/soreness.    Time 3    Period Weeks    Status New    Target Date 03/07/20             PT Long Term Goals - 02/15/20 0900      PT LONG TERM GOAL #1   Title Patient will report pain as </= 3/10 in Rt shoulder to improve quality of life.    Baseline 8/10 at worst    Time 6    Period Weeks    Status New    Target Date 03/28/20      PT LONG TERM GOAL #2   Title Patient will score at least 70% function on FOTO to signify clinically meaningful improvement in  functional abilities.    Baseline 53%    Time 6    Period Weeks    Status New    Target Date 03/28/20      PT LONG TERM GOAL #3   Title Patient will demonstrate functional Rt shoulder AROM (120 flexion and abduction, 45 ER) to improve ability to complete reaching and self-care activities.    Baseline see flowsheet    Time 6    Period Weeks    Status New    Target Date 03/28/20      PT LONG TERM GOAL #4   Title Patient will demonstrate at least 4/5 strength in Rt shoulder to improve ability to complete lifting activity.    Baseline see flowsheet    Time 6    Period Weeks    Status New    Target Date 03/28/20      PT LONG TERM GOAL #5   Title Patient will be independent with advanced HEP to maintain/progress current functional abilities.    Time 6    Period Weeks    Status New    Target Date 03/28/20                 Plan - 03/02/20 0841    Clinical Impression Statement pt reports consistency with her HEP and notes she feels improvement in ROM. Continued working on shoulder ROM with mobs and PROM with joint distraction oscillation. Followed with pulleys and IR with towel and self ER. IR/ER given as HEP today. She did well noting decreased stiffness end of session and improved flexion to 119 degrees and abduction to 105.    PT Treatment/Interventions ADLs/Self Care Home Management;Cryotherapy;Moist Heat;Electrical Stimulation;Functional mobility training;Therapeutic activities;Therapeutic exercise;Neuromuscular re-education;Patient/family education;Manual techniques;Passive range of motion;Dry needling;Taping;Vasopneumatic Device    PT Next Visit Plan Review HEP. Manual therapy (PROM, joint mobilizations) continuePulleys, AAROM finger ladder. rotator cuff isometrics    PT Home Exercise Plan Access Code [email or web address]           Patient will  benefit from skilled therapeutic intervention in order to improve the following deficits and impairments:  Decreased range of  motion,Decreased endurance,Impaired UE functional use,Pain,Hypomobility,Impaired flexibility,Decreased mobility,Decreased strength,Postural dysfunction  Visit Diagnosis: Right shoulder pain, unspecified chronicity  Stiffness of right shoulder, not elsewhere classified  Muscle weakness (generalized)     Problem List Patient Active Problem List   Diagnosis Date Noted  . Impingement syndrome of right shoulder 01/06/2020  . Palpitations 01/06/2020  . 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 Colon cancer screening 09/07/2019  . Stress reaction 03/04/2019  . Anemia 09/02/2017  . Chest discomfort 03/27/2017  . Routine general medical examination at a health care facility 06/19/2010    Starr Lake PT, DPT, LAT, ATC  03/02/20  8:46 AM      [place], [place], [address] Phone: [phone]   Fax:  [phone]  Name: Brooke Gill MRN: [id number] Date of Birth: May 31, 1970

## 2020-03-04 ENCOUNTER — Other Ambulatory Visit: Payer: Self-pay

## 2020-03-04 ENCOUNTER — Encounter: Payer: Self-pay | Admitting: Physical Therapy

## 2020-03-04 ENCOUNTER — Ambulatory Visit: Payer: Managed Care, Other (non HMO) | Admitting: Physical Therapy

## 2020-03-04 DIAGNOSIS — M6281 Muscle weakness (generalized): Secondary | ICD-10-CM

## 2020-03-04 DIAGNOSIS — M25511 Pain in right shoulder: Secondary | ICD-10-CM | POA: Diagnosis not present

## 2020-03-04 DIAGNOSIS — M25611 Stiffness of right shoulder, not elsewhere classified: Secondary | ICD-10-CM

## 2020-03-04 NOTE — Therapy (Signed)
[place], [place], [address] Phone: [phone]   Fax:  [phone]  Physical Therapy Treatment  Patient Details  Name: Brooke Gill MRN: [id number] Date of Birth: [birth year] Referring Provider (PT): Marybelle Killings, MD   Encounter Date: 03/04/2020   PT End of Session - 03/04/20 0721    Visit Number 3    Number of Visits 13    Date for PT Re-Evaluation 04/02/20    Authorization Type [employer]    PT Start Time 0715    PT Stop Time 0810    PT Time Calculation (min) 55 min           Past Medical History:  Diagnosis Date  . Anemia     Past Surgical History:  Procedure Laterality Date  . OVARIAN CYST SURGERY      There were no vitals filed for this visit.   Subjective Assessment - 03/04/20 0718    Subjective No pain at rest. 3/21 with certain movements like jerking or sudden movements otherwise and I very careful.    Currently in Pain? No/denies              [facility] PT Assessment - 03/04/20 0001      AROM   Right Shoulder Flexion 130 Degrees    Right Shoulder Internal Rotation --   reaches L3   Right Shoulder External Rotation --   reaches T2                        [facility] Adult PT Treatment/Exercise - 03/04/20 0001      Shoulder Exercises: Seated   Other Seated Exercises self shoulder ER with arm propped on pillow with trunk flexion 1 x 10 holding 3-5 seconds   cues to increase hold time to 20-30 sec , tolerated with 4/10 pain     Shoulder Exercises: Standing   Other Standing Exercises standing cane AAROM flexion, scaption, Extension, IR, ER    Other Standing Exercises standing shoulder IR with towel reaching across the back 1 x 10 holding 3-5 seconds   also rolled towel in axilla for joint distraction with stretch     Shoulder Exercises: Pulleys   Flexion 2 minutes    Scaption 2 minutes      Shoulder Exercises: Stretch   Table Stretch - Flexion 10 seconds;5 reps    Other  Shoulder Stretches upper trap stretch 1 x 30      Modalities   Modalities Moist Heat      Moist Heat Therapy   Number Minutes Moist Heat 10 Minutes    Moist Heat Location Shoulder      Manual Therapy   Joint Mobilization GHJ PA/AP, and inferior grade III    Passive ROM shoulder flexion/ abduction with gential distraction ossiclation for pain progress ROM intermittently PRN                    PT Short Term Goals - 02/15/20 0859      PT SHORT TERM GOAL #1   Title Patient will be independent and compliant with established HEP.    Baseline issued at eval.    Time 3    Period Weeks    Status New    Target Date 03/07/20      PT SHORT TERM GOAL #2   Title Patient will verbalize understanding of measures to assist in pain control.    Baseline Education on use  of heat/ice to assist in pain/soreness.    Time 3    Period Weeks    Status New    Target Date 03/07/20             PT Long Term Goals - 02/15/20 0900      PT LONG TERM GOAL #1   Title Patient will report pain as </= 3/10 in Rt shoulder to improve quality of life.    Baseline 8/10 at worst    Time 6    Period Weeks    Status New    Target Date 03/28/20      PT LONG TERM GOAL #2   Title Patient will score at least 70% function on FOTO to signify clinically meaningful improvement in functional abilities.    Baseline 53%    Time 6    Period Weeks    Status New    Target Date 03/28/20      PT LONG TERM GOAL #3   Title Patient will demonstrate functional Rt shoulder AROM (120 flexion and abduction, 45 ER) to improve ability to complete reaching and self-care activities.    Baseline see flowsheet    Time 6    Period Weeks    Status New    Target Date 03/28/20      PT LONG TERM GOAL #4   Title Patient will demonstrate at least 4/5 strength in Rt shoulder to improve ability to complete lifting activity.    Baseline see flowsheet    Time 6    Period Weeks    Status New    Target Date 03/28/20       PT LONG TERM GOAL #5   Title Patient will be independent with advanced HEP to maintain/progress current functional abilities.    Time 6    Period Weeks    Status New    Target Date 03/28/20                 Plan - 03/04/20 0800    Clinical Impression Statement Pt reports last session was helpful. She reports no pain at rest today. Began AAROM followed by joint mobilization and PROM in all planes. Reviewed new stretches for IR and ER and progressed the internal rotation stretch to over the shoulder. Her AROM/PROM continue to improve. HMP used at end of session to ease muscles tension and decrease soreness. She is progressing toward STGS.    PT Next Visit Plan Check STGS. Review HEP. Manual therapy (PROM, joint mobilizations) continuePulleys, AAROM finger ladder. rotator cuff isometrics    PT Home Exercise Plan Access Code [email or web address]           Patient will benefit from skilled therapeutic intervention in order to improve the following deficits and impairments:  Decreased range of motion,Decreased endurance,Impaired UE functional use,Pain,Hypomobility,Impaired flexibility,Decreased mobility,Decreased strength,Postural dysfunction  Visit Diagnosis: Right shoulder pain, unspecified chronicity  Stiffness of right shoulder, not elsewhere classified  Muscle weakness (generalized)     Problem List Patient Active Problem List   Diagnosis Date Noted  . Impingement syndrome of right shoulder 01/06/2020  . Palpitations 01/06/2020  . 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 Colon cancer screening 09/07/2019  . Stress reaction 03/04/2019  . Anemia 09/02/2017  . Chest discomfort 03/27/2017  . Routine general medical examination at a health care facility 06/19/2010    Dorene Ar , PTA 03/04/2020, 8:05 AM  [place], [place], [address] Phone: [phone]  Fax:  [phone]  Name:  Brooke Gill MRN: [id number] Date of Birth: [birth year]

## 2020-03-07 ENCOUNTER — Other Ambulatory Visit: Payer: Self-pay

## 2020-03-07 ENCOUNTER — Encounter: Payer: Self-pay | Admitting: Gastroenterology

## 2020-03-07 ENCOUNTER — Ambulatory Visit (HOSPITAL_COMMUNITY): Payer: Managed Care, Other (non HMO) | Attending: Cardiovascular Disease

## 2020-03-07 DIAGNOSIS — I493 Ventricular premature depolarization: Secondary | ICD-10-CM | POA: Diagnosis not present

## 2020-03-07 LAB — ECHOCARDIOGRAM COMPLETE
Area-P 1/2: 2.66 cm2
S' Lateral: 3.1 cm

## 2020-03-08 ENCOUNTER — Ambulatory Visit: Payer: Managed Care, Other (non HMO) | Attending: Orthopaedic Surgery | Admitting: Physical Therapy

## 2020-03-08 ENCOUNTER — Encounter: Payer: Self-pay | Admitting: Physical Therapy

## 2020-03-08 DIAGNOSIS — M6281 Muscle weakness (generalized): Secondary | ICD-10-CM | POA: Insufficient documentation

## 2020-03-08 DIAGNOSIS — M25611 Stiffness of right shoulder, not elsewhere classified: Secondary | ICD-10-CM | POA: Diagnosis present

## 2020-03-08 DIAGNOSIS — M25511 Pain in right shoulder: Secondary | ICD-10-CM | POA: Diagnosis present

## 2020-03-08 NOTE — Therapy (Signed)
[place], [place], [address] Phone: [phone]   Fax:  [phone]  Physical Therapy Treatment  Patient Details  Name: Brooke Gill MRN: [id number] Date of Birth: 05-09-1970 Referring Provider (PT): Brooke Killings, MD   Encounter Date: 03/08/2020   PT End of Session - 03/08/20 0809    Visit Number 4    Number of Visits 13    Date for PT Re-Evaluation 04/02/20    Authorization Type [employer] - FOTO at 6th and 10th visit    PT Start Time 0806    PT Stop Time 0844    PT Time Calculation (min) 38 min    Activity Tolerance Patient tolerated treatment well    Behavior During Therapy [facility] for tasks assessed/performed           Past Medical History:  Diagnosis Date  . Anemia     Past Surgical History:  Procedure Laterality Date  . OVARIAN CYST SURGERY      There were no vitals filed for this visit.   Subjective Assessment - 03/08/20 0810    Subjective " I feel like it is coming along. I still get that same at the end of the end of motion."              [facility] PT Assessment - 03/08/20 0001      Assessment   Medical Diagnosis Adhesive capsulitis of right shoulder    Referring Provider (PT) Brooke Killings, MD      AROM   Right Shoulder Flexion 136 Degrees    Right Shoulder ABduction 132 Degrees                         [facility] Adult PT Treatment/Exercise - 03/08/20 0001      Shoulder Exercises: Supine   Protraction AROM;15 reps   using dowel rod with tactile cues for proper form   Other Supine Exercises wand flexion 1 x 10 with elbows bent, 1 x 10 with elbows stretch      Shoulder Exercises: Seated   Other Seated Exercises lower trap strengthening 2 x 10 with red band, elbows propped on bolster      Shoulder Exercises: Standing   External Rotation Strengthening;Right;10 reps;Theraband    Theraband Level (Shoulder External Rotation) Level 3 (Green)    Internal Rotation  Right;Strengthening;10 reps;Theraband    Theraband Level (Shoulder Internal Rotation) Level 3 (Green)    Row Strengthening;10 reps;Theraband    Theraband Level (Shoulder Row) Level 3 (Green)      Manual Therapy   Manual Therapy Scapular mobilization    Manual therapy comments MTPR along the infraspinatus and upper trap x 2 ea.    Joint Mobilization GHJ PA/AP, and inferior grade III, AC joint inferior/ posterior mobs grade III    Scapular Mobilization upward rotation grade III   combined with active abduction in L sidelying                 PT Education - 03/08/20 0846    Education Details updated HEP today    Person(s) Educated Patient    Methods Explanation;Verbal cues;Handout    Comprehension Verbalized understanding;Verbal cues required            PT Short Term Goals - 03/08/20 0845      PT SHORT TERM GOAL #1   Title Patient will be independent and compliant with established HEP.    Period Weeks  Status Achieved      PT SHORT TERM GOAL #2   Title Patient will verbalize understanding of measures to assist in pain control.    Period Weeks    Status Achieved             PT Long Term Goals - 02/15/20 0900      PT LONG TERM GOAL #1   Title Patient will report pain as </= 3/10 in Rt shoulder to improve quality of life.    Baseline 8/10 at worst    Time 6    Period Weeks    Status New    Target Date 03/28/20      PT LONG TERM GOAL #2   Title Patient will score at least 70% function on FOTO to signify clinically meaningful improvement in functional abilities.    Baseline 53%    Time 6    Period Weeks    Status New    Target Date 03/28/20      PT LONG TERM GOAL #3   Title Patient will demonstrate functional Rt shoulder AROM (120 flexion and abduction, 45 ER) to improve ability to complete reaching and self-care activities.    Baseline see flowsheet    Time 6    Period Weeks    Status New    Target Date 03/28/20      PT LONG TERM GOAL #4   Title  Patient will demonstrate at least 4/5 strength in Rt shoulder to improve ability to complete lifting activity.    Baseline see flowsheet    Time 6    Period Weeks    Status New    Target Date 03/28/20      PT LONG TERM GOAL #5   Title Patient will be independent with advanced HEP to maintain/progress current functional abilities.    Time 6    Period Weeks    Status New    Target Date 03/28/20                 Plan - 03/08/20 0844    Clinical Impression Statement [name] continue to make great progress increasing shoulder flexion to 136 degrees and abduction to 132 degrees today. continued mobs for GHJ, scapualothoracic and AC joints and progressed to [facility] which she did well. updated HEP today to include isotonics. end of session she reported no pain or issues.    PT Next Visit Plan . Review HEP. Manual therapy (PROM, joint mobilizations), AAROM finger ladder. shoulder isotonics, scapular stab, MTPR / STW for infraspinatus, upper trap/ levator scapulae PRN.    PT Home Exercise Plan Access Code [email or web address]    Consulted and Agree with Plan of Care Patient           Patient will benefit from skilled therapeutic intervention in order to improve the following deficits and impairments:  Decreased range of motion,Decreased endurance,Impaired UE functional use,Pain,Hypomobility,Impaired flexibility,Decreased mobility,Decreased strength,Postural dysfunction  Visit Diagnosis: Right shoulder pain, unspecified chronicity  Stiffness of right shoulder, not elsewhere classified  Muscle weakness (generalized)     Problem List Patient Active Problem List   Diagnosis Date Noted  . Impingement syndrome of right shoulder 01/06/2020  . Palpitations 01/06/2020  . 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 Colon cancer screening 09/07/2019  . Stress reaction 03/04/2019  . Anemia 09/02/2017  . Chest discomfort 03/27/2017  . Routine general medical examination at a health care  facility 06/19/2010   Brooke Gill PT, DPT, LAT, ATC  03/08/20  8:48 AM      [facility] [address] [place], [place], [address] Phone: [phone]   Fax:  [phone]  Name: Brooke Gill MRN: [id number] Date of Birth: 11-15-1970

## 2020-03-11 ENCOUNTER — Ambulatory Visit: Payer: Managed Care, Other (non HMO) | Admitting: Physical Therapy

## 2020-03-11 ENCOUNTER — Encounter: Payer: Self-pay | Admitting: Physical Therapy

## 2020-03-11 ENCOUNTER — Other Ambulatory Visit: Payer: Self-pay

## 2020-03-11 DIAGNOSIS — M25611 Stiffness of right shoulder, not elsewhere classified: Secondary | ICD-10-CM

## 2020-03-11 DIAGNOSIS — M6281 Muscle weakness (generalized): Secondary | ICD-10-CM

## 2020-03-11 DIAGNOSIS — M25511 Pain in right shoulder: Secondary | ICD-10-CM

## 2020-03-11 NOTE — Therapy (Signed)
[place], [place], [address] Phone: [phone]   Fax:  [phone]  Physical Therapy Treatment  Patient Details  Name: Brooke Gill MRN: [id number] Date of Birth: 02/23/1970 Referring Provider (PT): Marybelle Killings, MD   Encounter Date: 03/11/2020   PT End of Session - 03/11/20 0725    Visit Number 5    Number of Visits 13    Date for PT Re-Evaluation 04/02/20    Authorization Type [employer] - FOTO at 6th and 10th visit    PT Start Time 0717    PT Stop Time 0800    PT Time Calculation (min) 43 min           Past Medical History:  Diagnosis Date  . Anemia     Past Surgical History:  Procedure Laterality Date  . OVARIAN CYST SURGERY      There were no vitals filed for this visit.   Subjective Assessment - 03/11/20 0725    Subjective The shoulder gets better and better each session. No pain now.    Currently in Pain? No/denies              [facility] PT Assessment - 03/11/20 0001      AROM   Right Shoulder Internal Rotation --   reaches L3, Reaches L1 af end of session     PROM   Right Shoulder External Rotation 40 Degrees   @ 45 degrees abduction                        [facility] Adult PT Treatment/Exercise - 03/11/20 0001      Shoulder Exercises: Supine   Protraction 20 reps    Protraction Weight (lbs) 2    Protraction Limitations on wooden dowel    Other Supine Exercises wand flexion 1 x 10 with elbows bent, 1 x 10 with elbows stretch   2# on dowel     Shoulder Exercises: Sidelying   ABduction 15 reps;AROM    Other Sidelying Exercises sleeper stretch 30 sec x 3      Shoulder Exercises: Standing   External Rotation 20 reps   2 sets   Theraband Level (Shoulder External Rotation) Level 3 (Green)    Internal Rotation 20 reps   2 sets   Theraband Level (Shoulder Internal Rotation) Level 3 (Green)    Row 20 reps    Theraband Level (Shoulder Row) Level 3 (Green)    Other  Standing Exercises standing shoulder IR with towel reaching across the back 1 x 10 holding 3-5 seconds   also rolled towel in axilla for joint distraction with stretch     Shoulder Exercises: Pulleys   Flexion 2 minutes    Scaption 2 minutes      Manual Therapy   Joint Mobilization GHJ PA/AP, and inferior grade III, AC joint inferior/ posterior mobs grade III    Passive ROM Flexion abduction, IR,ER                    PT Short Term Goals - 03/08/20 0845      PT SHORT TERM GOAL #1   Title Patient will be independent and compliant with established HEP.    Period Weeks    Status Achieved      PT SHORT TERM GOAL #2   Title Patient will verbalize understanding of measures to assist in pain control.    Period Weeks  Status Achieved             PT Long Term Goals - 02/15/20 0900      PT LONG TERM GOAL #1   Title Patient will report pain as </= 3/10 in Rt shoulder to improve quality of life.    Baseline 8/10 at worst    Time 6    Period Weeks    Status New    Target Date 03/28/20      PT LONG TERM GOAL #2   Title Patient will score at least 70% function on FOTO to signify clinically meaningful improvement in functional abilities.    Baseline 53%    Time 6    Period Weeks    Status New    Target Date 03/28/20      PT LONG TERM GOAL #3   Title Patient will demonstrate functional Rt shoulder AROM (120 flexion and abduction, 45 ER) to improve ability to complete reaching and self-care activities.    Baseline see flowsheet    Time 6    Period Weeks    Status New    Target Date 03/28/20      PT LONG TERM GOAL #4   Title Patient will demonstrate at least 4/5 strength in Rt shoulder to improve ability to complete lifting activity.    Baseline see flowsheet    Time 6    Period Weeks    Status New    Target Date 03/28/20      PT LONG TERM GOAL #5   Title Patient will be independent with advanced HEP to maintain/progress current functional abilities.    Time 6     Period Weeks    Status New    Target Date 03/28/20                 Plan - 03/11/20 0907    Clinical Impression Statement Pt reports she continues to improved. She reports continued difficulty with IR reaching behind back. Worked on self mobs, manual and PROM to increased ROM of right shoulder. Pt improved IR reach from L3 to L1 by end of session.Reviewed theraband exercises that she received last session and she is independent.  She declined [place] today.    PT Next Visit Plan FOTO status;. Review HEP. Manual therapy (PROM, joint mobilizations), AAROM finger ladder. shoulder isotonics, scapular stab, MTPR / STW for infraspinatus, upper trap/ levator scapulae PRN.    PT Home Exercise Plan Access Code [email or web address]    Consulted and Agree with Plan of Care Patient           Patient will benefit from skilled therapeutic intervention in order to improve the following deficits and impairments:  Decreased range of motion,Decreased endurance,Impaired UE functional use,Pain,Hypomobility,Impaired flexibility,Decreased mobility,Decreased strength,Postural dysfunction  Visit Diagnosis: Right shoulder pain, unspecified chronicity  Stiffness of right shoulder, not elsewhere classified  Muscle weakness (generalized)     Problem List Patient Active Problem List   Diagnosis Date Noted  . Impingement syndrome of right shoulder 01/06/2020  . Palpitations 01/06/2020  . 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 Colon cancer screening 09/07/2019  . Stress reaction 03/04/2019  . Anemia 09/02/2017  . Chest discomfort 03/27/2017  . Routine general medical examination at a health care facility 06/19/2010    Dorene Ar, PTA 03/11/2020, 9:20 AM  [facility] [address] [place], [place], [address] Phone: [phone]   Fax:  [phone]  Name: Brooke Gill MRN: [id number] Date  of Birth: [birth year]

## 2020-03-14 ENCOUNTER — Telehealth: Payer: Self-pay

## 2020-03-14 ENCOUNTER — Ambulatory Visit: Payer: Managed Care, Other (non HMO)

## 2020-03-14 NOTE — Telephone Encounter (Signed)
Patient states she had her days mixed up for her PT appointment. Patient confirmed next scheduled appointment.

## 2020-03-17 ENCOUNTER — Ambulatory Visit: Payer: Managed Care, Other (non HMO) | Admitting: Physical Therapy

## 2020-03-17 ENCOUNTER — Other Ambulatory Visit: Payer: Self-pay

## 2020-03-17 ENCOUNTER — Encounter: Payer: Self-pay | Admitting: Physical Therapy

## 2020-03-17 DIAGNOSIS — M25511 Pain in right shoulder: Secondary | ICD-10-CM

## 2020-03-17 DIAGNOSIS — M25611 Stiffness of right shoulder, not elsewhere classified: Secondary | ICD-10-CM

## 2020-03-17 DIAGNOSIS — M6281 Muscle weakness (generalized): Secondary | ICD-10-CM

## 2020-03-17 NOTE — Therapy (Signed)
[place], [place], [address] Phone: [phone]   Fax:  [phone]  Physical Therapy Treatment  Patient Details  Name: Brooke Gill MRN: [id number] Date of Birth: 30-Dec-1970 Referring Provider (PT): Marybelle Killings, MD   Encounter Date: 03/17/2020   PT End of Session - 03/17/20 0720    Visit Number 6    Number of Visits 13    Date for PT Re-Evaluation 04/02/20    Authorization Type [employer] - FOTO at 6th and 10th visit    PT Start Time 0715           Past Medical History:  Diagnosis Date  . Anemia     Past Surgical History:  Procedure Laterality Date  . OVARIAN CYST SURGERY      There were no vitals filed for this visit.   Subjective Assessment - 03/17/20 0717    Subjective No pain right now. I am able to reach into closet higher to get my shoes.    Currently in Pain? No/denies    Aggravating Factors  reaching behind the back activitiy    Pain Relieving Factors resting and avoiding movements that hurt              [facility] PT Assessment - 03/17/20 0001      Observation/Other Assessments   Focus on Therapeutic Outcomes (FOTO)  63% function      AROM   Right Shoulder Flexion 145 Degrees    Right Shoulder ABduction --   150 scaption   Right Shoulder Internal Rotation --   reaches T12   Right Shoulder External Rotation --   reaches T2                        [facility] Adult PT Treatment/Exercise - 03/17/20 0001      Shoulder Exercises: Sidelying   Other Sidelying Exercises sleeper stretch 30 sec x 3      Shoulder Exercises: Standing   External Rotation 20 reps   2 sets   Theraband Level (Shoulder External Rotation) Level 3 (Green)    External Rotation Limitations cues for eccentric control    Internal Rotation 20 reps   2 sets   Theraband Level (Shoulder Internal Rotation) Level 3 (Green)    Extension 20 reps    Theraband Level (Shoulder Extension) Level 3 (Green)    Row  20 reps    Theraband Level (Shoulder Row) Level 3 (Green)    Other Standing Exercises cabinet reaching 1# taping under top shelf x 20 in flexion and x20 in scaption    Other Standing Exercises towel in axilla IR mob x 3 follwed by AAROM IR with towel over shoulder x 3 for 30 sec, standing UE ranger IR AAROM (from floor)   also rolled towel in axilla for joint distraction with stretch     Shoulder Exercises: Pulleys   Flexion 2 minutes    Scaption 2 minutes      Shoulder Exercises: Stretch   Corner Stretch Limitations doorway stretch x 3    Wall Stretch - Flexion Limitations in doorway hold trim and distract / stretch x 3      Manual Therapy   Joint Mobilization GHJ PA/AP, and inferior grade III, AC joint inferior/ posterior mobs grade III    Passive ROM Flexion abduction, IR,ER                  PT Education - 03/17/20  0734    Education Details FOTO score: progress and prediction    Person(s) Educated Patient    Methods Explanation;Handout    Comprehension Verbalized understanding            PT Short Term Goals - 03/08/20 0845      PT SHORT TERM GOAL #1   Title Patient will be independent and compliant with established HEP.    Period Weeks    Status Achieved      PT SHORT TERM GOAL #2   Title Patient will verbalize understanding of measures to assist in pain control.    Period Weeks    Status Achieved             PT Long Term Goals - 02/15/20 0900      PT LONG TERM GOAL #1   Title Patient will report pain as </= 3/10 in Rt shoulder to improve quality of life.    Baseline 8/10 at worst    Time 6    Period Weeks    Status New    Target Date 03/28/20      PT LONG TERM GOAL #2   Title Patient will score at least 70% function on FOTO to signify clinically meaningful improvement in functional abilities.    Baseline 53%    Time 6    Period Weeks    Status New    Target Date 03/28/20      PT LONG TERM GOAL #3   Title Patient will demonstrate functional  Rt shoulder AROM (120 flexion and abduction, 45 ER) to improve ability to complete reaching and self-care activities.    Baseline see flowsheet    Time 6    Period Weeks    Status New    Target Date 03/28/20      PT LONG TERM GOAL #4   Title Patient will demonstrate at least 4/5 strength in Rt shoulder to improve ability to complete lifting activity.    Baseline see flowsheet    Time 6    Period Weeks    Status New    Target Date 03/28/20      PT LONG TERM GOAL #5   Title Patient will be independent with advanced HEP to maintain/progress current functional abilities.    Time 6    Period Weeks    Status New    Target Date 03/28/20                 Plan - 03/17/20 0740    Clinical Impression Statement Pt notes improved ability to reach in overhead shelves of closet to get shoes. Began reaching with 1# weight and she reported a min dull increasaed pain. Her IR reaching is improved but with compensation. Continued with passive and self joint mobs to increase shoulder ROM. Began doorway stretching for ER. Pt tolerated sesssion well with increased pain when stretching end ranges. FOTO score improved from 53% to 63%    PT Next Visit Plan Review HEP. Manual therapy (PROM, joint mobilizations), AAROM finger ladder. shoulder isotonics, scapular stab, MTPR / STW for infraspinatus, upper trap/ levator scapulae PRN.    PT Home Exercise Plan Access Code [email or web address]           Patient will benefit from skilled therapeutic intervention in order to improve the following deficits and impairments:  Decreased range of motion,Decreased endurance,Impaired UE functional use,Pain,Hypomobility,Impaired flexibility,Decreased mobility,Decreased strength,Postural dysfunction  Visit Diagnosis: Right shoulder pain, unspecified chronicity  Stiffness of right shoulder, not elsewhere classified  Muscle weakness (generalized)     Problem List Patient Active Problem List   Diagnosis Date Noted  .  Impingement syndrome of right shoulder 01/06/2020  . Palpitations 01/06/2020  . 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 Colon cancer screening 09/07/2019  . Stress reaction 03/04/2019  . Anemia 09/02/2017  . Chest discomfort 03/27/2017  . Routine general medical examination at a health care facility 06/19/2010    Dorene Ar, [place] 03/17/2020, 8:23 AM  [place], [place], [address] Phone: [phone]   Fax:  [phone]  Name: Jennelle Q Suman MRN: [id number] Date of Birth: [birth year]

## 2020-03-21 ENCOUNTER — Ambulatory Visit: Payer: Managed Care, Other (non HMO)

## 2020-03-21 ENCOUNTER — Other Ambulatory Visit: Payer: Self-pay

## 2020-03-21 DIAGNOSIS — M6281 Muscle weakness (generalized): Secondary | ICD-10-CM

## 2020-03-21 DIAGNOSIS — M25611 Stiffness of right shoulder, not elsewhere classified: Secondary | ICD-10-CM

## 2020-03-21 DIAGNOSIS — M25511 Pain in right shoulder: Secondary | ICD-10-CM

## 2020-03-21 NOTE — Therapy (Signed)
[place], [place], [address] Phone: [phone]   Fax:  [phone]  Physical Therapy Treatment/Re-certification  Patient Details  Name: Brooke Gill MRN: [id number] Date of Birth: 04-Mar-1970 Referring Provider (PT): Marybelle Killings, MD   Encounter Date: 03/21/2020   PT End of Session - 03/21/20 0718    Visit Number 7    Number of Visits 19    Date for PT Re-Evaluation 05/07/20    Authorization Type [employer] - FOTO at 6th and 10th visit    PT Start Time 0717    PT Stop Time 0758    PT Time Calculation (min) 41 min    Activity Tolerance Patient tolerated treatment well    Behavior During Therapy [facility] for tasks assessed/performed           Past Medical History:  Diagnosis Date  . Anemia     Past Surgical History:  Procedure Laterality Date  . OVARIAN CYST SURGERY      There were no vitals filed for this visit.   Subjective Assessment - 03/21/20 0722    Subjective "it feels about the same." She reports motion has increased and pain has decreased since starting PT. She reports compliance with HEP.    Limitations Lifting;House hold activities    Currently in Pain? No/denies              [facility] PT Assessment - 03/21/20 0001      Observation/Other Assessments   Focus on Therapeutic Outcomes (FOTO)  62% function      AROM   Overall AROM Comments pain with flexion, abduction, and ER end range    Right Shoulder Flexion 145 Degrees    Right Shoulder ABduction 150 Degrees    Right Shoulder Internal Rotation 50 Degrees    Right Shoulder External Rotation 40 Degrees      PROM   Right Shoulder Flexion 150 Degrees    Right Shoulder ABduction 120 Degrees    Right Shoulder Internal Rotation 50 Degrees    Right Shoulder External Rotation 55 Degrees      Strength   Right Shoulder Flexion 3-/5    Right Shoulder ABduction 3-/5    Right Shoulder Internal Rotation 4+/5    Right Shoulder External  Rotation 4/5                         [facility] Adult PT Treatment/Exercise - 03/21/20 0749      Self-Care   Other Self-Care Comments  see patient education      Shoulder Exercises: Standing   External Rotation 10 reps    External Rotation Limitations AAROM with dowel      Shoulder Exercises: Pulleys   Flexion 2 minutes    Scaption 2 minutes      Shoulder Exercises: ROM/Strengthening   UBE (Upper Arm Bike) 2 min each fwd/bwd level 1.5      Shoulder Exercises: Stretch   Other Shoulder Stretches functional IR stretch with strap 1 x 10      Manual Therapy   Joint Mobilization GHJ PA/AP, and inferior grade III, AC joint inferior/ posterior mobs grade III    Passive ROM Flexion abduction, IR,ER                  PT Education - 03/21/20 0753    Education Details Education on overall functional progress and POC moving forward    Person(s) Educated Patient  Methods Explanation    Comprehension Verbalized understanding            PT Short Term Goals - 03/21/20 0726      PT SHORT TERM GOAL #1   Title Patient will be independent and compliant with established HEP.    Period Weeks    Status Achieved      PT SHORT TERM GOAL #2   Title Patient will verbalize understanding of measures to assist in pain control.    Period Weeks    Status Achieved             PT Long Term Goals - 03/21/20 9562      PT LONG TERM GOAL #1   Title Patient will report pain as </= 3/10 in Rt shoulder to improve quality of life.    Baseline 5/10 at worst    Time 6    Period Weeks    Status On-going      PT LONG TERM GOAL #2   Title Patient will score at least 70% function on FOTO to signify clinically meaningful improvement in functional abilities.    Baseline 62% function    Time 6    Period Weeks    Status On-going      PT LONG TERM GOAL #3   Title Patient will demonstrate functional Rt shoulder AROM (120 flexion and abduction, 45 ER) to improve ability to complete  reaching and self-care activities.    Baseline see flowsheet    Time 6    Period Weeks    Status Partially Met      PT LONG TERM GOAL #4   Title Patient will demonstrate at least 4/5 strength in Rt shoulder to improve ability to complete lifting activity.    Baseline see flowsheet    Time 6    Period Weeks    Status Partially Met      PT LONG TERM GOAL #5   Title Patient will be independent with advanced HEP to maintain/progress current functional abilities.    Time 6    Period Weeks    Status On-going                 Plan - 03/21/20 0748    Clinical Impression Statement Patient has progressed well since start of care having met established short term functional goals and making progress towards achieving long term functional goals. Her shoulder A/PROM has significantly improved compared to baseline, though remains limited in all planes most notable into external and internal rotation. Her strength is gradually improving, though due to limited ROM lacks full strength at this time. She will benefit from continued care to further progress her Rt shoulder ROM and strength in order to return to optimal function.    PT Frequency --   1-2/week   PT Duration 6 weeks    PT Treatment/Interventions ADLs/Self Care Home Management;Cryotherapy;Moist Heat;Electrical Stimulation;Functional mobility training;Therapeutic activities;Therapeutic exercise;Neuromuscular re-education;Patient/family education;Manual techniques;Passive range of motion;Dry needling;Taping;Vasopneumatic Device    PT Next Visit Plan Review HEP. Manual therapy (PROM, joint mobilizations), AAROM finger ladder. shoulder isotonics, scapular stab, MTPR / STW for infraspinatus, upper trap/ levator scapulae PRN.    PT Home Exercise Plan Access Code [email or web address]    Consulted and Agree with Plan of Care Patient           Patient will benefit from skilled therapeutic intervention in order to improve the following deficits and  impairments:  Decreased range of motion,Decreased endurance,Impaired UE functional use,Pain,Hypomobility,Impaired flexibility,Decreased mobility,Decreased  strength,Postural dysfunction  Visit Diagnosis: Right shoulder pain, unspecified chronicity  Stiffness of right shoulder, not elsewhere classified  Muscle weakness (generalized)     Problem List Patient Active Problem List   Diagnosis Date Noted  . Impingement syndrome of right shoulder 01/06/2020  . Palpitations 01/06/2020  . 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 Colon cancer screening 09/07/2019  . Stress reaction 03/04/2019  . Anemia 09/02/2017  . Chest discomfort 03/27/2017  . Routine general medical examination at a health care facility 06/19/2010   Gwendolyn Grant, PT, DPT, ATC 03/21/20 8:07 AM  [facility] [address] [place], [place], [address] Phone: [phone]   Fax:  [phone]  Name: Joyelle Q Blais MRN: [id number] Date of Birth: 14-Mar-1970

## 2020-03-23 ENCOUNTER — Other Ambulatory Visit: Payer: Self-pay

## 2020-03-23 ENCOUNTER — Ambulatory Visit: Payer: Managed Care, Other (non HMO) | Admitting: Physical Therapy

## 2020-03-23 ENCOUNTER — Encounter: Payer: Self-pay | Admitting: Physical Therapy

## 2020-03-23 DIAGNOSIS — M25611 Stiffness of right shoulder, not elsewhere classified: Secondary | ICD-10-CM

## 2020-03-23 DIAGNOSIS — M6281 Muscle weakness (generalized): Secondary | ICD-10-CM

## 2020-03-23 DIAGNOSIS — M25511 Pain in right shoulder: Secondary | ICD-10-CM | POA: Diagnosis not present

## 2020-03-23 NOTE — Therapy (Signed)
[place], [place], [address] Phone: [phone]   Fax:  [phone]  Physical Therapy Treatment  Patient Details  Name: Brooke Gill MRN: [id number] Date of Birth: 1970/12/30 Referring Provider (PT): Marybelle Killings, MD   Encounter Date: 03/23/2020   PT End of Session - 03/23/20 0720    Visit Number 8    Number of Visits 19    Date for PT Re-Evaluation 05/07/20    Authorization Type [employer] - FOTO at 6th and 10th visit    PT Start Time 0720    PT Stop Time 0750   request to leave early   PT Time Calculation (min) 30 min           Past Medical History:  Diagnosis Date  . Anemia     Past Surgical History:  Procedure Laterality Date  . OVARIAN CYST SURGERY      There were no vitals filed for this visit.   Subjective Assessment - 03/23/20 0720    Subjective No pain this morning.    Currently in Pain? No/denies                             [facility] Adult PT Treatment/Exercise - 03/23/20 0001      Shoulder Exercises: Standing   Protraction Weight (lbs) shoulder punch green band x 20    External Rotation 20 reps   2 sets   Theraband Level (Shoulder External Rotation) Level 3 (Green)    Internal Rotation 20 reps   2 sets   Theraband Level (Shoulder Internal Rotation) Level 3 (Green)    Extension 20 reps    Theraband Level (Shoulder Extension) Level 3 (Green)    Row 20 reps    Theraband Level (Shoulder Row) Level 3 (Green)      Shoulder Exercises: ROM/Strengthening   UBE (Upper Arm Bike) 2 min each fwd/bwd level 2.0      Shoulder Exercises: Stretch   Corner Stretch Limitations doorway stretch x 3    Wall Stretch - Flexion Limitations in doorway hold trim and distract / stretch x 3    Other Shoulder Stretches functional IR stretch with strap 1 x 10   used wall to decrease elbow compensations     Manual Therapy   Joint Mobilization GHJ PA/AP, and inferior grade III, AC joint  inferior/ posterior mobs grade III    Passive ROM Flexion abduction, IR,ER                    PT Short Term Goals - 03/21/20 0726      PT SHORT TERM GOAL #1   Title Patient will be independent and compliant with established HEP.    Period Weeks    Status Achieved      PT SHORT TERM GOAL #2   Title Patient will verbalize understanding of measures to assist in pain control.    Period Weeks    Status Achieved             PT Long Term Goals - 03/21/20 6803      PT LONG TERM GOAL #1   Title Patient will report pain as </= 3/10 in Rt shoulder to improve quality of life.    Baseline 5/10 at worst    Time 6    Period Weeks    Status On-going      PT LONG TERM GOAL #2  Title Patient will score at least 70% function on FOTO to signify clinically meaningful improvement in functional abilities.    Baseline 62% function    Time 6    Period Weeks    Status On-going      PT LONG TERM GOAL #3   Title Patient will demonstrate functional Rt shoulder AROM (120 flexion and abduction, 45 ER) to improve ability to complete reaching and self-care activities.    Baseline see flowsheet    Time 6    Period Weeks    Status Partially Met      PT LONG TERM GOAL #4   Title Patient will demonstrate at least 4/5 strength in Rt shoulder to improve ability to complete lifting activity.    Baseline see flowsheet    Time 6    Period Weeks    Status Partially Met      PT LONG TERM GOAL #5   Title Patient will be independent with advanced HEP to maintain/progress current functional abilities.    Time 6    Period Weeks    Status On-going                 Plan - 03/23/20 0745    Clinical Impression Statement Pt requested to leave early today. Session focused on Manual therapy and PROM followed by PROM and strengthening.  Session tolerated well with pain at end range of motion. Used wall to block compensations at elbow with IR stretch.    PT Next Visit Plan Review HEP. Manual  therapy (PROM, joint mobilizations), AAROM finger ladder. shoulder isotonics, scapular stab, MTPR / STW for infraspinatus, upper trap/ levator scapulae PRN.    PT Home Exercise Plan Access Code [email or web address]           Patient will benefit from skilled therapeutic intervention in order to improve the following deficits and impairments:  Decreased range of motion,Decreased endurance,Impaired UE functional use,Pain,Hypomobility,Impaired flexibility,Decreased mobility,Decreased strength,Postural dysfunction  Visit Diagnosis: Right shoulder pain, unspecified chronicity  Stiffness of right shoulder, not elsewhere classified  Muscle weakness (generalized)     Problem List Patient Active Problem List   Diagnosis Date Noted  . Impingement syndrome of right shoulder 01/06/2020  . Palpitations 01/06/2020  . 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 Colon cancer screening 09/07/2019  . Stress reaction 03/04/2019  . Anemia 09/02/2017  . Chest discomfort 03/27/2017  . Routine general medical examination at a health care facility 06/19/2010    Dorene Ar, PTA 03/23/2020, 8:01 AM  [place], [place], [address] Phone: [phone]   Fax:  [phone]  Name: Brooke Gill MRN: [id number] Date of Birth: Nov 11, 1970

## 2020-03-28 ENCOUNTER — Ambulatory Visit: Payer: Managed Care, Other (non HMO)

## 2020-03-28 ENCOUNTER — Other Ambulatory Visit: Payer: Self-pay

## 2020-03-28 DIAGNOSIS — M25611 Stiffness of right shoulder, not elsewhere classified: Secondary | ICD-10-CM

## 2020-03-28 DIAGNOSIS — M25511 Pain in right shoulder: Secondary | ICD-10-CM

## 2020-03-28 DIAGNOSIS — M6281 Muscle weakness (generalized): Secondary | ICD-10-CM

## 2020-03-28 NOTE — Therapy (Signed)
[place], [place], [address] Phone: [phone]   Fax:  [phone]  Physical Therapy Treatment  Patient Details  Name: Brooke Gill MRN: [id number] Date of Birth: 1971/01/26 Referring Provider (PT): Marybelle Killings, MD   Encounter Date: 03/28/2020   PT End of Session - 03/28/20 0721    Visit Number 9    Number of Visits 19    Date for PT Re-Evaluation 05/07/20    Authorization Type [employer] - FOTO at 6th and 10th visit    PT Start Time 0720    PT Stop Time 0800    PT Time Calculation (min) 40 min    Activity Tolerance Patient tolerated treatment well    Behavior During Therapy [facility] for tasks assessed/performed           Past Medical History:  Diagnosis Date  . Anemia     Past Surgical History:  Procedure Laterality Date  . OVARIAN CYST SURGERY      There were no vitals filed for this visit.   Subjective Assessment - 03/28/20 0722    Subjective Patient reports she is doing good without reports of pain. She reports compliance with HEP.    Currently in Pain? No/denies              [facility] PT Assessment - 03/28/20 0001      AROM   Right Shoulder Flexion 150 Degrees    Right Shoulder ABduction 155 Degrees    Right Shoulder Internal Rotation 50 Degrees    Right Shoulder External Rotation 50 Degrees                         [facility] Adult PT Treatment/Exercise - 03/28/20 0001      Self-Care   Other Self-Care Comments  see patient education      Shoulder Exercises: Standing   External Rotation 10 reps    Theraband Level (Shoulder External Rotation) Level 3 (Green)    External Rotation Limitations reactive isometric    Internal Rotation 10 reps    Theraband Level (Shoulder Internal Rotation) Level 3 (Green)    Internal Rotation Limitations reactive isometric      Shoulder Exercises: ROM/Strengthening   UBE (Upper Arm Bike) 2 min each fwd/bwd level 2.0    Other  ROM/Strengthening Exercises finger ladder flexion and abduction 1 x 5 each      Shoulder Exercises: Stretch   Other Shoulder Stretches functional IR with strap 1  x10; 10 sec hold    Other Shoulder Stretches sleeper stretch 3 x 30 sec      Manual Therapy   Joint Mobilization GHJ PA/AP, and inferior grade III, AC joint inferior/ posterior mobs grade III    Passive ROM Flexion abduction, IR,ER                  PT Education - 03/28/20 0743    Education Details Updated HEP.    Person(s) Educated Patient    Methods Explanation;Handout;Demonstration;Verbal cues    Comprehension Verbalized understanding;Returned demonstration            PT Short Term Goals - 03/21/20 0726      PT SHORT TERM GOAL #1   Title Patient will be independent and compliant with established HEP.    Period Weeks    Status Achieved      PT SHORT TERM GOAL #2   Title Patient will verbalize understanding of measures to  assist in pain control.    Period Weeks    Status Achieved             PT Long Term Goals - 03/21/20 2836      PT LONG TERM GOAL #1   Title Patient will report pain as </= 3/10 in Rt shoulder to improve quality of life.    Baseline 5/10 at worst    Time 6    Period Weeks    Status On-going      PT LONG TERM GOAL #2   Title Patient will score at least 70% function on FOTO to signify clinically meaningful improvement in functional abilities.    Baseline 62% function    Time 6    Period Weeks    Status On-going      PT LONG TERM GOAL #3   Title Patient will demonstrate functional Rt shoulder AROM (120 flexion and abduction, 45 ER) to improve ability to complete reaching and self-care activities.    Baseline see flowsheet    Time 6    Period Weeks    Status Partially Met      PT LONG TERM GOAL #4   Title Patient will demonstrate at least 4/5 strength in Rt shoulder to improve ability to complete lifting activity.    Baseline see flowsheet    Time 6    Period Weeks     Status Partially Met      PT LONG TERM GOAL #5   Title Patient will be independent with advanced HEP to maintain/progress current functional abilities.    Time 6    Period Weeks    Status On-going                 Plan - 03/28/20 6294    Clinical Impression Statement AROM continues to gradually improve with most notable restriction remaining into external and internal rotation. Good tolerance to manual therapy with patient reporting minor dull pain at available end range of passive abduction and ER/IR. Overall good tolerance to AAROM and strengthening exercises with minor pain reported during IR AAROM.    PT Treatment/Interventions ADLs/Self Care Home Management;Cryotherapy;Moist Heat;Electrical Stimulation;Functional mobility training;Therapeutic activities;Therapeutic exercise;Neuromuscular re-education;Patient/family education;Manual techniques;Passive range of motion;Dry needling;Taping;Vasopneumatic Device    PT Next Visit Plan Manual therapy (PROM, joint mobilizations), AAROM finger ladder. shoulder isotonics, scapular stab, MTPR / STW for infraspinatus, upper trap/ levator scapulae PRN.    PT Home Exercise Plan Access Code [email or web address]    Consulted and Agree with Plan of Care Patient           Patient will benefit from skilled therapeutic intervention in order to improve the following deficits and impairments:  Decreased range of motion,Decreased endurance,Impaired UE functional use,Pain,Hypomobility,Impaired flexibility,Decreased mobility,Decreased strength,Postural dysfunction  Visit Diagnosis: Right shoulder pain, unspecified chronicity  Stiffness of right shoulder, not elsewhere classified  Muscle weakness (generalized)     Problem List Patient Active Problem List   Diagnosis Date Noted  . Impingement syndrome of right shoulder 01/06/2020  . Palpitations 01/06/2020  . 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 Colon cancer screening 09/07/2019  . Stress  reaction 03/04/2019  . Anemia 09/02/2017  . Chest discomfort 03/27/2017  . Routine general medical examination at a health care facility 06/19/2010   Gwendolyn Grant, PT, DPT, ATC 03/28/20 8:01 AM  [facility] [address] [place], [place], [address] Phone: [phone]   Fax:  [phone]  Name: Brooke Gill MRN: [id number] Date of  Birth: [birth year]

## 2020-04-04 ENCOUNTER — Ambulatory Visit (AMBULATORY_SURGERY_CENTER): Payer: Managed Care, Other (non HMO) | Admitting: *Deleted

## 2020-04-04 ENCOUNTER — Other Ambulatory Visit: Payer: Self-pay

## 2020-04-04 VITALS — Ht 66.5 in | Wt 162.0 lb

## 2020-04-04 DIAGNOSIS — Z1211 Encounter for screening for malignant neoplasm of colon: Secondary | ICD-10-CM

## 2020-04-04 MED ORDER — NA SULFATE-K SULFATE-MG SULF 17.5-3.13-1.6 GM/177ML PO SOLN: ORAL | 0 refills | Status: DC

## 2020-04-04 NOTE — Progress Notes (Signed)
Patient's pre-visit was done today over the phone with the patient due to COVID-19 pandemic. Name,DOB and address verified. Insurance verified. Patient denies any allergies to Eggs and Soy. Patient denies any problems with anesthesia/sedation. Patient denies taking diet pills or blood thinners. Packet of Prep instructions mailed to patient including a copy of a consent form and pre-procedure patient acknowledgement form (with envelope to mail back to us)-pt is aware. Prep coupon included. Patient understands to call us back with any questions or concerns. The patient is COVID-19 fully vaccinated, per patient. Patient is aware of our care-partner policy and [id number] safety protocol. EMMI education assigned to the patient for the procedure, sent to [place].

## 2020-04-07 ENCOUNTER — Encounter: Payer: Self-pay | Admitting: Physical Therapy

## 2020-04-07 ENCOUNTER — Ambulatory Visit: Payer: Managed Care, Other (non HMO) | Attending: Orthopaedic Surgery | Admitting: Physical Therapy

## 2020-04-07 ENCOUNTER — Other Ambulatory Visit: Payer: Self-pay

## 2020-04-07 DIAGNOSIS — M6281 Muscle weakness (generalized): Secondary | ICD-10-CM | POA: Diagnosis present

## 2020-04-07 DIAGNOSIS — M25611 Stiffness of right shoulder, not elsewhere classified: Secondary | ICD-10-CM | POA: Insufficient documentation

## 2020-04-07 DIAGNOSIS — M25511 Pain in right shoulder: Secondary | ICD-10-CM | POA: Diagnosis not present

## 2020-04-07 NOTE — Therapy (Signed)
[place], [place], [address] Phone: [phone]   Fax:  [phone]  Physical Therapy Treatment  Patient Details  Name: Brooke Gill MRN: [id number] Date of Birth: [birth year] Referring Provider (PT): Marybelle Killings, MD   Encounter Date: 04/07/2020   PT End of Session - 04/07/20 0720    Visit Number 10    Number of Visits 19    Date for PT Re-Evaluation 05/07/20    Authorization Type [employer] - FOTO at 6th and 10th visit    PT Start Time 0716    PT Stop Time 0755    PT Time Calculation (min) 39 min           Past Medical History:  Diagnosis Date  . Anemia     Past Surgical History:  Procedure Laterality Date  . OVARIAN CYST SURGERY      There were no vitals filed for this visit.   Subjective Assessment - 04/07/20 0719    Subjective Patient reports the shoulder is okay. She reports not performing the exercises as much as she should have since last visit.    Currently in Pain? No/denies              [facility] PT Assessment - 04/07/20 0001      AROM   Right Shoulder Internal Rotation --   Reach to T8   Right Shoulder External Rotation --   reaches T3     PROM   Right Shoulder Flexion 155 Degrees    Right Shoulder Internal Rotation 65 Degrees    Right Shoulder External Rotation 60 Degrees      Strength   Right Shoulder Flexion 4-/5    Right Shoulder ABduction 4-/5                         [facility] Adult PT Treatment/Exercise - 04/07/20 0001      Shoulder Exercises: Supine   External Rotation 20 reps    Theraband Level (Shoulder External Rotation) Level 3 (Green)    External Rotation Limitations bilateral      Shoulder Exercises: Standing   Protraction Weight (lbs) shoulder punch green band x 20    External Rotation 20 reps   1 set of reactive isometrcis   Theraband Level (Shoulder External Rotation) Level 3 (Green)    Internal Rotation 20 reps   1 set of reactive  iisometrics   Theraband Level (Shoulder Internal Rotation) Level 3 (Green)    Row 20 reps    Theraband Level (Shoulder Row) Level 3 (Green)    Other Standing Exercises towel in axilla IR mob x 3 follwed by AAROM IR with towel over shoulder x 3 for 30 sec, standing UE ranger IR AAROM (from floor)   also rolled towel in axilla for joint distraction with stretch     Shoulder Exercises: Stretch   Corner Stretch Limitations doorway stretch x 3   single arm and bilateral     Manual Therapy   Joint Mobilization GHJ PA/AP, and inferior grade III, AC joint inferior/ posterior mobs grade III    Passive ROM Flexion abduction, IR,ER                    PT Short Term Goals - 03/21/20 0726      PT SHORT TERM GOAL #1   Title Patient will be independent and compliant with established HEP.    Period Weeks  Status Achieved      PT SHORT TERM GOAL #2   Title Patient will verbalize understanding of measures to assist in pain control.    Period Weeks    Status Achieved             PT Long Term Goals - 04/07/20 9476      PT LONG TERM GOAL #1   Title Patient will report pain as </= 3/10 in Rt shoulder to improve quality of life.    Baseline average 3-4/10 and she limits the mobility, less sharp pain    Time 6    Period Weeks    Status On-going      PT LONG TERM GOAL #2   Title Patient will score at least 70% function on FOTO to signify clinically meaningful improvement in functional abilities.    Baseline 62% function    Time 6    Period Weeks    Status On-going      PT LONG TERM GOAL #3   Title Patient will demonstrate functional Rt shoulder AROM (120 flexion and abduction, 45 ER) to improve ability to complete reaching and self-care activities.    Baseline see flowsheet    Time 6    Period Weeks    Status Achieved      PT LONG TERM GOAL #4   Title Patient will demonstrate at least 4/5 strength in Rt shoulder to improve ability to complete lifting activity.    Baseline  see flowsheet    Time 6    Period Weeks    Status Partially Met      PT LONG TERM GOAL #5   Title Patient will be independent with advanced HEP to maintain/progress current functional abilities.    Time 6    Period Weeks    Status On-going                 Plan - 04/07/20 0756    Clinical Impression Statement Pt reports average pain is 3--4/10 right shoulder and she limits mobility so to not increase pain. Today she demonstrates improved AROM especially with IR reaching to T-8 and improved PROM to 65 IR. She is progressing toward ROM goals.    PT Next Visit Plan Manual therapy (PROM, joint mobilizations), AAROM finger ladder. shoulder isotonics, scapular stab, MTPR / STW for infraspinatus, upper trap/ levator scapulae PRN.    PT Home Exercise Plan Access Code [email or web address]           Patient will benefit from skilled therapeutic intervention in order to improve the following deficits and impairments:  Decreased range of motion,Decreased endurance,Impaired UE functional use,Pain,Hypomobility,Impaired flexibility,Decreased mobility,Decreased strength,Postural dysfunction  Visit Diagnosis: Right shoulder pain, unspecified chronicity  Stiffness of right shoulder, not elsewhere classified  Muscle weakness (generalized)     Problem List Patient Active Problem List   Diagnosis Date Noted  . Impingement syndrome of right shoulder 01/06/2020  . Palpitations 01/06/2020  . 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 Colon cancer screening 09/07/2019  . Stress reaction 03/04/2019  . Anemia 09/02/2017  . Chest discomfort 03/27/2017  . Routine general medical examination at a health care facility 06/19/2010    Dorene Ar, PTA 04/07/2020, 8:10 AM  [place], [place], [address] Phone: [phone]   Fax:  [phone]  Name: Brooke Gill MRN: [id number] Date of Birth: 02/16/1970

## 2020-04-11 ENCOUNTER — Encounter: Payer: Self-pay | Admitting: Gastroenterology

## 2020-04-11 ENCOUNTER — Other Ambulatory Visit: Payer: Self-pay

## 2020-04-11 ENCOUNTER — Encounter: Payer: Self-pay | Admitting: Physical Therapy

## 2020-04-11 ENCOUNTER — Ambulatory Visit: Payer: Managed Care, Other (non HMO) | Admitting: Physical Therapy

## 2020-04-11 DIAGNOSIS — M25511 Pain in right shoulder: Secondary | ICD-10-CM | POA: Diagnosis not present

## 2020-04-11 DIAGNOSIS — M25611 Stiffness of right shoulder, not elsewhere classified: Secondary | ICD-10-CM

## 2020-04-11 DIAGNOSIS — M6281 Muscle weakness (generalized): Secondary | ICD-10-CM

## 2020-04-11 NOTE — Patient Instructions (Signed)
Access Code: [email or web address] URL: [email or web address] Date: 04/11/2020 Prepared by: Hessie Diener  Exercises Supine Shoulder Flexion Extension AAROM with Dowel - 2 x daily - 7 x weekly - 2 sets - 10 reps Supine Shoulder Abduction AAROM with Dowel - 2 x daily - 7 x weekly - 2 sets - 10 reps Supine Shoulder External Rotation in 45 Degrees Abduction AAROM with Dowel - 2 x daily - 7 x weekly - 2 sets - 10 reps Seated Shoulder Flexion Towel Slide at Table Top - 2 x daily - 7 x weekly - 2 sets - 10 reps Standing Shoulder Internal Rotation AAROM Behind Back with Towel - 1 x daily - 7 x weekly - 2 sets - 1 reps - 3-5 seconds hold Seated Shoulder External Rotation PROM on Table - 1 x daily - 7 x weekly - 2 sets - 10 reps - 3-5 secons hold Shoulder Internal Rotation - 1 x daily - 7 x weekly - 2 sets - 10 reps Shoulder External Rotation - 1 x daily - 7 x weekly - 2 sets - 10 reps Standing Shoulder Row with Anchored Resistance - 1 x daily - 7 x weekly - 2 sets - 10 reps Sleeper Stretch - 2 x daily - 7 x weekly - 3 sets - 30 sec hold Standing Shoulder Flexion with Resistance - 1 x daily - 7 x weekly - 2 sets - 10 reps Standing Single Arm Shoulder Abduction with Resistance - 1 x daily - 7 x weekly - 2 sets - 10 reps

## 2020-04-11 NOTE — Therapy (Addendum)
[place], [place], [address] Phone: [phone]   Fax:  [phone]  Physical Therapy Treatment  Patient Details  Name: Brooke Gill MRN: [id number] Date of Birth: March 19, 1970 Referring Provider (PT): Marybelle Killings, MD   Encounter Date: 04/11/2020   PT End of Session - 04/11/20 0724    Visit Number 11    Number of Visits 19    Date for PT Re-Evaluation 05/07/20    Authorization Type [employer] - FOTO at 6th and 10th visit (was completed on 6th and 11th)    PT Start Time 0722    PT Stop Time 0800    PT Time Calculation (min) 38 min           Past Medical History:  Diagnosis Date  . Anemia     Past Surgical History:  Procedure Laterality Date  . OVARIAN CYST SURGERY      There were no vitals filed for this visit.   Subjective Assessment - 04/11/20 0724    Subjective I was able to reach into the back seat. I have not been able to do that in awhile. I have been stretching.    Currently in Pain? No/denies              [facility] PT Assessment - 04/11/20 0001      Observation/Other Assessments   Focus on Therapeutic Outcomes (FOTO)  67% function      AROM   Right Shoulder Internal Rotation --   Reach to T8     PROM   Right Shoulder Internal Rotation 60 Degrees                         [facility] Adult PT Treatment/Exercise - 04/11/20 0001      Shoulder Exercises: Supine   Diagonals 15 reps    Theraband Level (Shoulder Diagonals) Level 3 (Green)      Shoulder Exercises: Standing   Protraction Weight (lbs) shoulder punch green band x 20    Horizontal ABduction 20 reps    Theraband Level (Shoulder Horizontal ABduction) Level 3 (Green)    External Rotation 20 reps   1 set of reactive isometrcis   Theraband Level (Shoulder External Rotation) Level 3 (Green)    External Rotation Limitations reactive isometric    Internal Rotation 20 reps   1 set of reactive iisometrics   Theraband  Level (Shoulder Internal Rotation) Level 3 (Green)    Internal Rotation Limitations reactive isometric    Flexion 10 reps    Theraband Level (Shoulder Flexion) Level 2 (Red)    Flexion Limitations forward raise to 90    ABduction 10 reps    Theraband Level (Shoulder ABduction) Level 2 (Red)    ABduction Limitations lateral raise to 90    Diagonals 10 reps    Theraband Level (Shoulder Diagonals) Level 2 (Red)    Other Standing Exercises cabinet reaching 2# 10 x 2    Other Standing Exercises towel in axilla IR mob x 3 follwed by AAROM IR with towel over shoulder x 3 for 30 sec, standing UE ranger IR AAROM (from floor)   also rolled towel in axilla for joint distraction with stretch     Shoulder Exercises: ROM/Strengthening   UBE (Upper Arm Bike) 2 min each fwd/bwd level 2.0      Shoulder Exercises: Stretch   Corner Stretch Limitations doorway stretch x 3   single arm and bilateral  Manual Therapy   Joint Mobilization GHJ PA/AP, and inferior grade III, AC joint inferior/ posterior mobs grade III    Passive ROM Flexion abduction, IR,ER                  PT Education - 04/11/20 0825    Education Details HEP, red band    Person(s) Educated Patient    Methods Explanation;Handout    Comprehension Verbalized understanding            PT Short Term Goals - 03/21/20 0726      PT SHORT TERM GOAL #1   Title Patient will be independent and compliant with established HEP.    Period Weeks    Status Achieved      PT SHORT TERM GOAL #2   Title Patient will verbalize understanding of measures to assist in pain control.    Period Weeks    Status Achieved             PT Long Term Goals - 04/11/20 5621      PT LONG TERM GOAL #1   Title Patient will report pain as </= 3/10 in Rt shoulder to improve quality of life.    Baseline average 3-4/10 and she limits the mobility, less sharp pain    Time 6    Period Weeks    Status On-going      PT LONG TERM GOAL #2   Title  Patient will score at least 70% function on FOTO to signify clinically meaningful improvement in functional abilities.    Baseline 67% function    Time 6    Period Weeks    Status On-going      PT LONG TERM GOAL #3   Title Patient will demonstrate functional Rt shoulder AROM (120 flexion and abduction, 45 ER) to improve ability to complete reaching and self-care activities.    Baseline see flowsheet    Time 6    Period Weeks    Status Achieved      PT LONG TERM GOAL #4   Title Patient will demonstrate at least 4/5 strength in Rt shoulder to improve ability to complete lifting activity.    Baseline see flowsheet    Time 6    Period Weeks    Status On-going      PT LONG TERM GOAL #5   Title Patient will be independent with advanced HEP to maintain/progress current functional abilities.    Time 6    Status On-going                 Plan - 04/11/20 0725    Clinical Impression Statement Pt reports improved reaching behind back and reaching into the back seat of the car. FOTO score improved to 67% which is 3 % less than predicted outcome. Continued with right shoudler strength and ROM. Given shoulder flexion and abduction strength with bands for HEP.Her shoulder IR AROM has improved but with compensation. Most limitation is in Passive IR.    PT Next Visit Plan Manual therapy (PROM, joint mobilizations), AAROM finger ladder. shoulder isotonics, scapular stab, MTPR / STW for infraspinatus, upper trap/ levator scapulae PRN.    PT Home Exercise Plan Access Code [email or web address]           Patient will benefit from skilled therapeutic intervention in order to improve the following deficits and impairments:  Decreased range of motion,Decreased endurance,Impaired UE functional use,Pain,Hypomobility,Impaired flexibility,Decreased mobility,Decreased strength,Postural dysfunction  Visit Diagnosis: Right shoulder pain, unspecified chronicity  Stiffness of right shoulder, not elsewhere  classified  Muscle weakness (generalized)     Problem List Patient Active Problem List   Diagnosis Date Noted  . Impingement syndrome of right shoulder 01/06/2020  . Palpitations 01/06/2020  . 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 Colon cancer screening 09/07/2019  . Stress reaction 03/04/2019  . Anemia 09/02/2017  . Chest discomfort 03/27/2017  . Routine general medical examination at a health care facility 06/19/2010    Dorene Ar, PTA 04/11/2020, 8:27 AM  [place], [place], [address] Phone: [phone]   Fax:  [phone]  Name: Kamayah Q Mcwhirter MRN: [id number] Date of Birth: 1970/08/19

## 2020-04-13 ENCOUNTER — Ambulatory Visit: Payer: Managed Care, Other (non HMO) | Admitting: Physical Therapy

## 2020-04-13 ENCOUNTER — Other Ambulatory Visit: Payer: Self-pay

## 2020-04-13 ENCOUNTER — Encounter: Payer: Self-pay | Admitting: Physical Therapy

## 2020-04-13 DIAGNOSIS — M25511 Pain in right shoulder: Secondary | ICD-10-CM

## 2020-04-13 DIAGNOSIS — M25611 Stiffness of right shoulder, not elsewhere classified: Secondary | ICD-10-CM

## 2020-04-13 DIAGNOSIS — M6281 Muscle weakness (generalized): Secondary | ICD-10-CM

## 2020-04-13 NOTE — Therapy (Signed)
[place], [place], [address] Phone: [phone]   Fax:  [phone]  Physical Therapy Treatment  Patient Details  Name: Brooke Gill MRN: [id number] Date of Birth: 1970-10-13 Referring Provider (PT): Marybelle Killings, MD   Encounter Date: 04/13/2020   PT End of Session - 04/13/20 0801    Visit Number 12    Number of Visits 19    Date for PT Re-Evaluation 05/07/20    Authorization Type [employer] -    PT Start Time 0801    PT Stop Time 0842    PT Time Calculation (min) 41 min    Activity Tolerance Patient tolerated treatment well    Behavior During Therapy [facility] for tasks assessed/performed           Past Medical History:  Diagnosis Date  . Anemia     Past Surgical History:  Procedure Laterality Date  . OVARIAN CYST SURGERY      There were no vitals filed for this visit.   Subjective Assessment - 04/13/20 0802    Subjective "I am doing much better, Still reaching back is still a task."    Currently in Pain? Yes    Pain Score 0-No pain   at worst 6/10 with reaching back   Pain Location Shoulder    Pain Orientation Right    Pain Type Chronic pain    Pain Frequency Intermittent    Aggravating Factors  reaching back              [facility] PT Assessment - 04/13/20 0001      Assessment   Medical Diagnosis Adhesive capsulitis of right shoulder    Referring Provider (PT) Marybelle Killings, MD                         [facility] Adult PT Treatment/Exercise - 04/13/20 0001      Shoulder Exercises: Seated   Flexion Strengthening;10 reps   scaption angle 2x 10 with controlled eccentics   Flexion Weight (lbs) 1      Shoulder Exercises: Prone   Other Prone Exercises I's T's and Y's 2 x 10      Shoulder Exercises: Standing   External Rotation 12 reps;10 reps;Strengthening;Right;Theraband   x 2 sets 90/90 with row into ER   Theraband Level (Shoulder External Rotation) Level 2 (Red)    Internal  Rotation Strengthening;10 reps;Theraband   x 2 sets at 90/90 IR with scapular protraction   Theraband Level (Shoulder Internal Rotation) Level 2 (Red)      Shoulder Exercises: Body Blade   Flexion 15 seconds;2 reps    ABduction 15 seconds;2 reps    Other Body Blade Exercises ir/er 4 x 30 sec      Manual Therapy   Manual therapy comments MTPR focusing on the supraspinatus    Joint Mobilization scapular downward/ upward, distal clavivle grade III inferior/ posterior                  PT Education - 04/13/20 0837    Education Details reviewd and updated HEP            PT Short Term Goals - 03/21/20 0726      PT SHORT TERM GOAL #1   Title Patient will be independent and compliant with established HEP.    Period Weeks    Status Achieved      PT SHORT TERM GOAL #2   Title  Patient will verbalize understanding of measures to assist in pain control.    Period Weeks    Status Achieved             PT Long Term Goals - 04/11/20 2993      PT LONG TERM GOAL #1   Title Patient will report pain as </= 3/10 in Rt shoulder to improve quality of life.    Baseline average 3-4/10 and she limits the mobility, less sharp pain    Time 6    Period Weeks    Status On-going      PT LONG TERM GOAL #2   Title Patient will score at least 70% function on FOTO to signify clinically meaningful improvement in functional abilities.    Baseline 67% function    Time 6    Period Weeks    Status On-going      PT LONG TERM GOAL #3   Title Patient will demonstrate functional Rt shoulder AROM (120 flexion and abduction, 45 ER) to improve ability to complete reaching and self-care activities.    Baseline see flowsheet    Time 6    Period Weeks    Status Achieved      PT LONG TERM GOAL #4   Title Patient will demonstrate at least 4/5 strength in Rt shoulder to improve ability to complete lifting activity.    Baseline see flowsheet    Time 6    Period Weeks    Status On-going      PT  LONG TERM GOAL #5   Title Patient will be independent with advanced HEP to maintain/progress current functional abilities.    Time 6    Status On-going                 Plan - 04/13/20 0840    Clinical Impression Statement pt arrives reporting pain mostly occuring with reaching back and notes pain climbes to a 6/10 but is fleeting once she is out of position. Following MTRP of the supraspinatus and scapualr mobs, retested position to which she noted significant relief of tension/ soreness. continued working shoulder strengthening progressing IR/ER to 90/90 and fatigue muscle using body blade. end of session she noted feeling fatigued but denied any pain.    PT Treatment/Interventions ADLs/Self Care Home Management;Cryotherapy;Moist Heat;Electrical Stimulation;Functional mobility training;Therapeutic activities;Therapeutic exercise;Neuromuscular re-education;Patient/family education;Manual techniques;Passive range of motion;Dry needling;Taping;Vasopneumatic Device    PT Next Visit Plan Manual therapy Discuss DN for supraspinatus,  (PROM, joint mobilizations), posterior chaing strengthening.  scapular stab, MTPR / STW for infraspinatus, upper trap/ levator scapulae PRN.           Patient will benefit from skilled therapeutic intervention in order to improve the following deficits and impairments:  Decreased range of motion,Decreased endurance,Impaired UE functional use,Pain,Hypomobility,Impaired flexibility,Decreased mobility,Decreased strength,Postural dysfunction  Visit Diagnosis: Right shoulder pain, unspecified chronicity  Stiffness of right shoulder, not elsewhere classified  Muscle weakness (generalized)     Problem List Patient Active Problem List   Diagnosis Date Noted  . Impingement syndrome of right shoulder 01/06/2020  . Palpitations 01/06/2020  . 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 Colon cancer screening 09/07/2019  . Stress reaction 03/04/2019  .  Anemia 09/02/2017  . Chest discomfort 03/27/2017  . Routine general medical examination at a health care facility 06/19/2010    Starr Lake PT, DPT, LAT, ATC  04/13/20  8:47 AM      [place], [place],  [address] Phone: [phone]   Fax:  [phone]  Name: Brooke Gill MRN: [id number] Date of Birth: 10-05-1970

## 2020-04-25 ENCOUNTER — Telehealth: Payer: Self-pay | Admitting: Physical Therapy

## 2020-04-25 ENCOUNTER — Ambulatory Visit: Payer: Managed Care, Other (non HMO) | Admitting: Physical Therapy

## 2020-04-25 ENCOUNTER — Telehealth: Payer: Self-pay | Admitting: Gastroenterology

## 2020-04-25 NOTE — Telephone Encounter (Signed)
Patient called and would like to switch her prep medication from Suprep to a pill form prep please advise.

## 2020-04-25 NOTE — Telephone Encounter (Signed)
Spoke to patient regarding her missed appointment at 845. She reports she forgot about the appointment. She does plan to attend her next appointment.

## 2020-04-25 NOTE — Telephone Encounter (Signed)
Returned call to pt- informed her Dr Loletha Carrow prefers we not use the Sutabs and explained to her reason's why- informed her to drink her Suprep with a straw, drink it slow- chase it with another beverage, - pt verbalized understanding

## 2020-04-27 ENCOUNTER — Ambulatory Visit (AMBULATORY_SURGERY_CENTER): Payer: Managed Care, Other (non HMO) | Admitting: Gastroenterology

## 2020-04-27 ENCOUNTER — Other Ambulatory Visit: Payer: Self-pay

## 2020-04-27 ENCOUNTER — Encounter: Payer: Self-pay | Admitting: Gastroenterology

## 2020-04-27 VITALS — BP 113/74 | HR 65 | Temp 96.4°F | Resp 11 | Ht 66.0 in | Wt 162.0 lb

## 2020-04-27 DIAGNOSIS — K635 Polyp of colon: Secondary | ICD-10-CM

## 2020-04-27 DIAGNOSIS — Z1211 Encounter for screening for malignant neoplasm of colon: Secondary | ICD-10-CM | POA: Diagnosis not present

## 2020-04-27 DIAGNOSIS — D122 Benign neoplasm of ascending colon: Secondary | ICD-10-CM

## 2020-04-27 DIAGNOSIS — D120 Benign neoplasm of cecum: Secondary | ICD-10-CM

## 2020-04-27 MED ORDER — SODIUM CHLORIDE 0.9 % IV SOLN: 500.0000 mL | INTRAVENOUS | Status: DC

## 2020-04-27 NOTE — Op Note (Signed)
[place] Patient Name: Brooke Gill Procedure Date: 04/27/2020 7:51 AM MRN: [id number] Endoscopist: Mallie Mussel L. Loletha Carrow , MD Age: 50 Referring MD:  Date of Birth: Apr 01, 1970 Gender: Female Account #: [id number] Procedure:                Colonoscopy Indications:              Screening for colorectal malignant neoplasm, This                            is the patient's first colonoscopy Medicines:                Monitored Anesthesia Care Procedure:                Pre-Anesthesia Assessment:                           - Prior to the procedure, a History and Physical                            was performed, and patient medications and                            allergies were reviewed. The patient's tolerance of                            previous anesthesia was also reviewed. The risks                            and benefits of the procedure and the sedation                            options and risks were discussed with the patient.                            All questions were answered, and informed consent                            was obtained. Prior Anticoagulants: The patient has                            taken no previous anticoagulant or antiplatelet                            agents. ASA Grade Assessment: I - A normal, healthy                            patient. After reviewing the risks and benefits,                            the patient was deemed in satisfactory condition to                            undergo the procedure.  After obtaining informed consent, the colonoscope                            was passed under direct vision. Throughout the                            procedure, the patient's blood pressure, pulse, and                            oxygen saturations were monitored continuously. The                            Olympus PCF-H190DL [id number]) Colonoscope was                            introduced through the anus and advanced to  the the                            cecum, identified by appendiceal orifice and                            ileocecal valve. The colonoscopy was performed                            without difficulty. The patient tolerated the                            procedure well. The quality of the bowel                            preparation was excellent. The ileocecal valve,                            appendiceal orifice, and rectum were photographed.                            The bowel preparation used was SUPREP. Scope In: 8:00:41 AM Scope Out: 8:16:50 AM Scope Withdrawal Time: 0 hours 13 minutes 57 seconds  Total Procedure Duration: 0 hours 16 minutes 9 seconds  Findings:                 The perianal and digital rectal examinations were                            normal.                           Two sessile polyps were found in the ascending                            colon and cecum. The polyps were 5 to 8 mm in size.                            These polyps were removed with a cold snare.  Resection and retrieval were complete.                           Multiple small-mouthed diverticula were found in                            the left colon and right colon.                           The exam was otherwise without abnormality on                            direct and retroflexion views. Complications:            No immediate complications. Estimated Blood Loss:     Estimated blood loss was minimal. Impression:               - Two 5 to 8 mm polyps in the ascending colon and                            in the cecum, removed with a cold snare. Resected                            and retrieved.                           - Diverticulosis in the left colon and in the right                            colon.                           - The examination was otherwise normal on direct                            and retroflexion views. Recommendation:           - Patient has  a contact number available for                            emergencies. The signs and symptoms of potential                            delayed complications were discussed with the                            patient. Return to normal activities tomorrow.                            Written discharge instructions were provided to the                            patient.                           - Resume previous diet.                           -  Continue present medications.                           - Await pathology results.                           - Repeat colonoscopy is recommended for                            surveillance. The colonoscopy date will be                            determined after pathology results from today's                            exam become available for review. Kellan Raffield L. Loletha Carrow, MD 04/27/2020 8:20:20 AM This report has been signed electronically.

## 2020-04-27 NOTE — Progress Notes (Signed)
Report given to PACU, vss 

## 2020-04-27 NOTE — Progress Notes (Signed)
Called to room to assist during endoscopic procedure.  Patient ID and intended procedure confirmed with present staff. Received instructions for my participation in the procedure from the performing physician.  

## 2020-04-27 NOTE — Patient Instructions (Signed)
Handouts on polyps and diverticulosis. Await pathology results.   YOU HAD AN ENDOSCOPIC PROCEDURE TODAY AT [facility]:   Refer to the procedure report that was given to you for any specific questions about what was found during the examination.  If the procedure report does not answer your questions, please call your gastroenterologist to clarify.  If you requested that your care partner not be given the details of your procedure findings, then the procedure report has been included in a sealed envelope for you to review at your convenience later.  YOU SHOULD EXPECT: Some feelings of bloating in the abdomen. Passage of more gas than usual.  Walking can help get rid of the air that was put into your GI tract during the procedure and reduce the bloating. If you had a lower endoscopy (such as a colonoscopy or flexible sigmoidoscopy) you may notice spotting of blood in your stool or on the toilet paper. If you underwent a bowel prep for your procedure, you may not have a normal bowel movement for a few days.  Please Note:  You might notice some irritation and congestion in your nose or some drainage.  This is from the oxygen used during your procedure.  There is no need for concern and it should clear up in a day or so.  SYMPTOMS TO REPORT IMMEDIATELY:   Following lower endoscopy (colonoscopy or flexible sigmoidoscopy):  Excessive amounts of blood in the stool  Significant tenderness or worsening of abdominal pains  Swelling of the abdomen that is new, acute  Fever of 100F or higher  For urgent or emergent issues, a gastroenterologist can be reached at any hour by calling [phone]. Do not use MyChart messaging for urgent concerns.    DIET:  We do recommend a small meal at first, but then you may proceed to your regular diet.  Drink plenty of fluids but you should avoid alcoholic beverages for 24 hours.  ACTIVITY:  You should plan to take it easy for the rest of today and  you should NOT DRIVE or use heavy machinery until tomorrow (because of the sedation medicines used during the test).    FOLLOW UP: Our staff will call the number listed on your records 48-72 hours following your procedure to check on you and address any questions or concerns that you may have regarding the information given to you following your procedure. If we do not reach you, we will leave a message.  We will attempt to reach you two times.  During this call, we will ask if you have developed any symptoms of COVID 19. If you develop any symptoms (ie: fever, flu-like symptoms, shortness of breath, cough etc.) before then, please call [phone].  If you test positive for Covid 19 in the 2 weeks post procedure, please call and report this information to [place].    If any biopsies were taken you will be contacted by phone or by letter within the next 1-3 weeks.  Please call us at [phone] if you have not heard about the biopsies in 3 weeks.    SIGNATURES/CONFIDENTIALITY: You and/or your care partner have signed paperwork which will be entered into your electronic medical record.  These signatures attest to the fact that that the information above on your After Visit Summary has been reviewed and is understood.  Full responsibility of the confidentiality of this discharge information lies with you and/or your care-partner.

## 2020-04-28 ENCOUNTER — Ambulatory Visit: Payer: Managed Care, Other (non HMO) | Admitting: Physical Therapy

## 2020-04-28 ENCOUNTER — Encounter: Payer: Self-pay | Admitting: Physical Therapy

## 2020-04-28 DIAGNOSIS — M6281 Muscle weakness (generalized): Secondary | ICD-10-CM

## 2020-04-28 DIAGNOSIS — M25511 Pain in right shoulder: Secondary | ICD-10-CM

## 2020-04-28 DIAGNOSIS — M25611 Stiffness of right shoulder, not elsewhere classified: Secondary | ICD-10-CM

## 2020-04-28 NOTE — Therapy (Signed)
[place], [place], [address] Phone: [phone]   Fax:  [phone]  Physical Therapy Treatment  Patient Details  Name: Brooke Gill MRN: [id number] Date of Birth: July 28, 1970 Referring Provider (PT): Marybelle Killings, MD   Encounter Date: 04/28/2020   PT End of Session - 04/28/20 0752    Visit Number 13    Number of Visits 19    Date for PT Re-Evaluation 05/07/20    Authorization Type [employer] -    PT Start Time 0718    PT Stop Time 0748   requested to leave early   PT Time Calculation (min) 30 min           Past Medical History:  Diagnosis Date  . Anemia     Past Surgical History:  Procedure Laterality Date  . OVARIAN CYST SURGERY      There were no vitals filed for this visit.   Subjective Assessment - 04/28/20 0721    Subjective I have been working on my exercises. I am beter with reaching behind my back. I am using my arm more.    Currently in Pain? No/denies              [facility] PT Assessment - 04/28/20 0001      AROM   Right Shoulder Flexion 165 Degrees    Right Shoulder ABduction 160 Degrees    Right Shoulder Internal Rotation --   Reach to T8   Right Shoulder External Rotation --   reaches T3     Strength   Right Shoulder Flexion 4/5    Right Shoulder ABduction 4/5    Right Shoulder External Rotation 4/5    Right Shoulder Horizontal ABduction 4/5                         [facility] Adult PT Treatment/Exercise - 04/28/20 0001      Shoulder Exercises: Standing   External Rotation Limitations Free motion 7# 10 x 2    Internal Rotation Limitations Free motion 7# 10 x 2    Other Standing Exercises cabinet reaching 3# 10 x 2, flexion circles on wall x 1 minute , abduction circles x 1 minute on wall , standing scaption 3# x 20    Other Standing Exercises counter push ups 10 x2      Shoulder Exercises: ROM/Strengthening   UBE (Upper Arm Bike) 2 min each fwd/bwd level 2.0     Lat Pull Limitations standing 20# x 20    Cybex Row 20 reps    Cybex Row Limitations 20# , then mid rows 20# x 20 each      Shoulder Exercises: Body Blade   Flexion 15 seconds;2 reps    ABduction 15 seconds;2 reps    Other Body Blade Exercises ir/er 4 x 30 sec                    PT Short Term Goals - 03/21/20 0726      PT SHORT TERM GOAL #1   Title Patient will be independent and compliant with established HEP.    Period Weeks    Status Achieved      PT SHORT TERM GOAL #2   Title Patient will verbalize understanding of measures to assist in pain control.    Period Weeks    Status Achieved             PT Long Term  Goals - 04/28/20 0802      PT LONG TERM GOAL #1   Title Patient will report pain as </= 3/10 in Rt shoulder to improve quality of life.    Time 6    Period Weeks    Status On-going      PT LONG TERM GOAL #2   Title Patient will score at least 70% function on FOTO to signify clinically meaningful improvement in functional abilities.    Baseline 67% function    Time 6    Period Weeks    Status On-going      PT LONG TERM GOAL #3   Title Patient will demonstrate functional Rt shoulder AROM (120 flexion and abduction, 45 ER) to improve ability to complete reaching and self-care activities.    Time 6    Period Weeks    Status Achieved      PT LONG TERM GOAL #4   Title Patient will demonstrate at least 4/5 strength in Rt shoulder to improve ability to complete lifting activity.    Baseline see flowsheet    Time 6    Period Weeks    Status Achieved      PT LONG TERM GOAL #5   Title Patient will be independent with advanced HEP to maintain/progress current functional abilities.    Time 6    Period Weeks    Status On-going                 Plan - 04/28/20 9678    Clinical Impression Statement Pt reports she is using the arm more normally but notes episodes of numb and tingling with sleeping on that side. Her greatest difficulty is  reaching behind back however this motion has improved and she is reaching to T-8/bra strap. Her AROM and strength tests have improved. Session focused on strengthening and  progression to gym machines. She tolerated session well with fatigue, no pain. LTG#4 met.    PT Next Visit Plan Manual therapy Discuss DN for supraspinatus,  (PROM, joint mobilizations), posterior chaing strengthening.  scapular stab, MTPR / STW for infraspinatus, upper trap/ levator scapulae PRN. consider lifting and carrying activities, continue gym macines for strength    PT Home Exercise Plan Access Code [email or web address]           Patient will benefit from skilled therapeutic intervention in order to improve the following deficits and impairments:  Decreased range of motion,Decreased endurance,Impaired UE functional use,Pain,Hypomobility,Impaired flexibility,Decreased mobility,Decreased strength,Postural dysfunction  Visit Diagnosis: Right shoulder pain, unspecified chronicity  Stiffness of right shoulder, not elsewhere classified  Muscle weakness (generalized)     Problem List Patient Active Problem List   Diagnosis Date Noted  . Impingement syndrome of right shoulder 01/06/2020  . Palpitations 01/06/2020  . 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 Colon cancer screening 09/07/2019  . Stress reaction 03/04/2019  . Anemia 09/02/2017  . Chest discomfort 03/27/2017  . Routine general medical examination at a health care facility 06/19/2010    Dorene Ar , [place] 04/28/2020, 8:03 AM  [place], [place], [address] Phone: [phone]   Fax:  [phone]  Name: Brooke Gill MRN: [id number] Date of Birth: [birth year]

## 2020-04-29 ENCOUNTER — Telehealth: Payer: Self-pay | Admitting: *Deleted

## 2020-04-29 NOTE — Telephone Encounter (Signed)
  Follow up Call-  Call back number 04/27/2020  Post procedure Call Back phone  # [phone]  Permission to leave phone message Yes  Some recent data might be hidden     Patient questions:  Do you have a fever, pain , or abdominal swelling? No. Pain Score  0 *  Have you tolerated food without any problems? Yes.    Have you been able to return to your normal activities? Yes.    Do you have any questions about your discharge instructions: Diet   No. Medications  No. Follow up visit  No.  Do you have questions or concerns about your Care? No.  Actions: * If pain score is 4 or above: No action needed, pain <4.  1. Have you developed a fever since your procedure? no  2.   Have you had an respiratory symptoms (SOB or cough) since your procedure? no  3.   Have you tested positive for COVID 19 since your procedure no  4.   Have you had any family members/close contacts diagnosed with the COVID 19 since your procedure?  no   If yes to any of these questions please route to Joylene John, RN and Joella Prince, RN

## 2020-05-02 ENCOUNTER — Ambulatory Visit: Payer: Managed Care, Other (non HMO) | Admitting: Physical Therapy

## 2020-05-04 ENCOUNTER — Ambulatory Visit: Payer: Managed Care, Other (non HMO) | Admitting: Physical Therapy

## 2020-05-06 ENCOUNTER — Encounter: Payer: Self-pay | Admitting: Gastroenterology

## 2020-05-09 ENCOUNTER — Other Ambulatory Visit: Payer: Self-pay

## 2020-05-09 ENCOUNTER — Ambulatory Visit: Payer: Managed Care, Other (non HMO) | Attending: Family Medicine | Admitting: Physical Therapy

## 2020-05-09 ENCOUNTER — Encounter: Payer: Self-pay | Admitting: Physical Therapy

## 2020-05-09 DIAGNOSIS — M25511 Pain in right shoulder: Secondary | ICD-10-CM | POA: Diagnosis present

## 2020-05-09 DIAGNOSIS — M6281 Muscle weakness (generalized): Secondary | ICD-10-CM | POA: Insufficient documentation

## 2020-05-09 DIAGNOSIS — M25611 Stiffness of right shoulder, not elsewhere classified: Secondary | ICD-10-CM | POA: Insufficient documentation

## 2020-05-09 NOTE — Therapy (Addendum)
[place], [place], [address] Phone: [phone]   Fax:  [phone]  Physical Therapy Treatment / Re-certification / Discharge  Patient Details  Name: Brooke Gill MRN: [id number] Date of Birth: Mar 21, 1970 Referring Provider (PT): Marybelle Killings, MD   Encounter Date: 05/09/2020   PT End of Session - 05/09/20 0803    Visit Number 14    Number of Visits 19    Date for PT Re-Evaluation 06/06/20    PT Start Time 0802    PT Stop Time 0841    PT Time Calculation (min) 39 min    Activity Tolerance Patient tolerated treatment well    Behavior During Therapy [facility] for tasks assessed/performed           Past Medical History:  Diagnosis Date  . Anemia     Past Surgical History:  Procedure Laterality Date  . OVARIAN CYST SURGERY      There were no vitals filed for this visit.   Subjective Assessment - 05/09/20 0804    Subjective " no pain or issues. My pain is still there only whenI reach back. I just got back from [place] last night and with travelling with lifting luggage no issues."    Currently in Pain? No/denies    Pain Score 0-No pain   3/10 with reaching backward   Pain Frequency Intermittent    Aggravating Factors  reaching back              [facility] PT Assessment - 05/09/20 0001      Assessment   Medical Diagnosis Adhesive capsulitis of right shoulder    Referring Provider (PT) Marybelle Killings, MD      Strength   Right Shoulder Flexion 4+/5    Right Shoulder ABduction 4+/5    Right Shoulder External Rotation 4+/5    Right Shoulder Horizontal ABduction 4+/5                         [facility] Adult PT Treatment/Exercise - 05/09/20 0001      Shoulder Exercises: ROM/Strengthening   UBE (Upper Arm Bike) L5 x 4 min FWD/BWD x 10  m      Shoulder Exercises: Stretch   Other Shoulder Stretches --      Manual Therapy   Manual Therapy Soft tissue mobilization    Manual therapy  comments skilled palptation and monitoring of pt throughout TPDN    Soft tissue mobilization IASTM along infra/supraspinatus            Trigger Point Dry Needling - 05/09/20 0001    Consent Given? Yes    Education Handout Provided Yes    Muscles Treated Upper Quadrant Infraspinatus;Supraspinatus    Supraspinatus Response Twitch response elicited;Palpable increased muscle length    Infraspinatus Response Twitch response elicited;Palpable increased muscle length                PT Education - 05/09/20 0807    Education Details Reviewed HEP. discussed DN andbenefits of treatment. upgraded band resistance and discussed proper progression of strengthening. reviewed POC dropping to 1 x in 2 weeks to assess responsed to treatment followed with HEP to assess if more PT is needed.    Person(s) Educated Patient    Methods Explanation;Verbal cues    Comprehension Verbalized understanding;Verbal cues required            PT Short Term Goals - 05/09/20 2979  PT SHORT TERM GOAL #1   Title Patient will be independent and compliant with established HEP.    Status Achieved             PT Long Term Goals - 05/09/20 5364      PT LONG TERM GOAL #1   Title Patient will report pain as </= 3/10 in Rt shoulder to improve quality of life.    Period Weeks    Status Achieved      PT LONG TERM GOAL #2   Title Patient will score at least 70% function on FOTO to signify clinically meaningful improvement in functional abilities.    Period Weeks      PT LONG TERM GOAL #3   Title Patient will demonstrate functional Rt shoulder AROM (120 flexion and abduction, 45 ER) to improve ability to complete reaching and self-care activities.    Period Weeks    Status Achieved      PT LONG TERM GOAL #4   Title Patient will demonstrate at least 4/5 strength in Rt shoulder to improve ability to complete lifting activity.    Status Achieved      PT LONG TERM GOAL #5   Title Patient will be  independent with advanced HEP to maintain/progress current functional abilities.    Period Weeks                 Plan - 05/09/20 6803    Clinical Impression Statement Brooke Gill is making excellent progress with physical therapy increasing shoulder AROM and strength and additionally reports on 3/10 pain that occurs with reaching back. educated and consent was given for TPDN focusing on the infra/supraspinatus followed with IASTM techniques. She responded well to gross shoulder muscle activation which she did well with. end of session she noted decreased pain with reaching backward. Plan to see pt for 1 more visit in 2 weeks to assess response to treatment and determine if more PT is needed vs discharge.    PT Frequency 1x / week    PT Duration 3 weeks    PT Treatment/Interventions ADLs/Self Care Home Management;Cryotherapy;Moist Heat;Electrical Stimulation;Functional mobility training;Therapeutic activities;Therapeutic exercise;Neuromuscular re-education;Patient/family education;Manual techniques;Passive range of motion;Dry needling;Taping;Vasopneumatic Device    PT Next Visit Plan response to DN, potential D/C, gross shoulder strengthening,    PT Home Exercise Plan Access Code [email or web address]    Consulted and Agree with Plan of Care Patient           Patient will benefit from skilled therapeutic intervention in order to improve the following deficits and impairments:  Decreased range of motion,Decreased endurance,Impaired UE functional use,Pain,Hypomobility,Impaired flexibility,Decreased mobility,Decreased strength,Postural dysfunction  Visit Diagnosis: Right shoulder pain, unspecified chronicity  Stiffness of right shoulder, not elsewhere classified  Muscle weakness (generalized)     Problem List Patient Active Problem List   Diagnosis Date Noted  . Impingement syndrome of right shoulder 01/06/2020  . Palpitations 01/06/2020  . 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  Colon cancer screening 09/07/2019  . Stress reaction 03/04/2019  . Anemia 09/02/2017  . Chest discomfort 03/27/2017  . Routine general medical examination at a health care facility 06/19/2010   [place]arr [place] PT, DPT, LAT, ATC  05/09/20  8:50 AM      [facility] [address] [place], [place], [address] Phone: [phone]   Fax:  [phone]  Name: Brooke Gill MRN: [id number] Date of Birth: 1970-07-02     PHYSICAL THERAPY DISCHARGE SUMMARY  Visits from [facility]  of Care: 14  Current functional level related to goals / functional outcomes: See goals   Remaining deficits: Current status unknown   Education / Equipment: HEP, theraband  Plan: Patient agrees to discharge.  Patient goals were partially met. Patient is being discharged due to not returning since the last visit.  ?????        Deya Bigos PT, DPT, LAT, ATC  06/11/20  4:30 PM

## 2020-05-11 ENCOUNTER — Ambulatory Visit: Payer: Managed Care, Other (non HMO) | Admitting: Physical Therapy

## 2020-05-16 ENCOUNTER — Encounter: Payer: Managed Care, Other (non HMO) | Admitting: Physical Therapy

## 2020-05-18 ENCOUNTER — Encounter: Payer: Managed Care, Other (non HMO) | Admitting: Physical Therapy

## 2020-08-31 ENCOUNTER — Telehealth: Payer: Self-pay | Admitting: Family Medicine

## 2020-08-31 DIAGNOSIS — D500 Iron deficiency anemia secondary to blood loss (chronic): Secondary | ICD-10-CM

## 2020-08-31 DIAGNOSIS — Z0000 Encounter for general adult medical examination without abnormal findings: Secondary | ICD-10-CM

## 2020-08-31 NOTE — Telephone Encounter (Signed)
-----   Message from Ellamae Sia sent at 08/15/2020  9:21 AM EDT ----- Regarding: Lab orders for Thursday, 7.28.22 Patient is scheduled for CPX labs, please order future labs, Thanks , Karna Christmas

## 2020-09-01 ENCOUNTER — Other Ambulatory Visit (INDEPENDENT_AMBULATORY_CARE_PROVIDER_SITE_OTHER): Payer: Managed Care, Other (non HMO)

## 2020-09-01 ENCOUNTER — Other Ambulatory Visit: Payer: Self-pay

## 2020-09-01 DIAGNOSIS — Z0000 Encounter for general adult medical examination without abnormal findings: Secondary | ICD-10-CM

## 2020-09-01 DIAGNOSIS — D500 Iron deficiency anemia secondary to blood loss (chronic): Secondary | ICD-10-CM

## 2020-09-01 LAB — CBC WITH DIFFERENTIAL/PLATELET
Basophils Absolute: 0.1 10*3/uL (ref 0.0–0.1)
Basophils Relative: 1.2 % (ref 0.0–3.0)
Eosinophils Absolute: 0.2 10*3/uL (ref 0.0–0.7)
Eosinophils Relative: 2.9 % (ref 0.0–5.0)
HCT: 31.5 % — ABNORMAL LOW (ref 36.0–46.0)
Hemoglobin: 10.3 g/dL — ABNORMAL LOW (ref 12.0–15.0)
Lymphocytes Relative: 38 % (ref 12.0–46.0)
Lymphs Abs: 2 10*3/uL (ref 0.7–4.0)
MCHC: 32.7 g/dL (ref 30.0–36.0)
MCV: 84.4 fl (ref 78.0–100.0)
Monocytes Absolute: 0.4 10*3/uL (ref 0.1–1.0)
Monocytes Relative: 8.3 % (ref 3.0–12.0)
Neutro Abs: 2.6 10*3/uL (ref 1.4–7.7)
Neutrophils Relative %: 49.6 % (ref 43.0–77.0)
Platelets: 261 10*3/uL (ref 150.0–400.0)
RBC: 3.73 Mil/uL — ABNORMAL LOW (ref 3.87–5.11)
RDW: 14.6 % (ref 11.5–15.5)
WBC: 5.3 10*3/uL (ref 4.0–10.5)

## 2020-09-01 LAB — COMPREHENSIVE METABOLIC PANEL
ALT: 12 U/L (ref 0–35)
AST: 12 U/L (ref 0–37)
Albumin: 3.8 g/dL (ref 3.5–5.2)
Alkaline Phosphatase: 79 U/L (ref 39–117)
BUN: 9 mg/dL (ref 6–23)
CO2: 25 mEq/L (ref 19–32)
Calcium: 8.6 mg/dL (ref 8.4–10.5)
Chloride: 107 mEq/L (ref 96–112)
Creatinine, Ser: 0.88 mg/dL (ref 0.40–1.20)
GFR: 76.71 mL/min (ref >60.00)
Glucose, Bld: 85 mg/dL (ref 70–99)
Potassium: 4 mEq/L (ref 3.5–5.1)
Sodium: 140 mEq/L (ref 135–145)
Total Bilirubin: 0.4 mg/dL (ref 0.2–1.2)
Total Protein: 6.6 g/dL (ref 6.0–8.3)

## 2020-09-01 LAB — IRON: Iron: 70 ug/dL (ref 42–145)

## 2020-09-01 LAB — FERRITIN: Ferritin: 43.8 ng/mL (ref 10.0–291.0)

## 2020-09-01 LAB — LIPID PANEL
Cholesterol: 132 mg/dL (ref 0–200)
HDL: 59.2 mg/dL (ref >39.00)
LDL Cholesterol: 65 mg/dL (ref 0–99)
NonHDL: 73
Total CHOL/HDL Ratio: 2
Triglycerides: 42 mg/dL (ref 0.0–149.0)
VLDL: 8.4 mg/dL (ref 0.0–40.0)

## 2020-09-01 LAB — TSH: TSH: 1.65 u[IU]/mL (ref 0.35–5.50)

## 2020-09-07 ENCOUNTER — Ambulatory Visit (INDEPENDENT_AMBULATORY_CARE_PROVIDER_SITE_OTHER): Payer: Managed Care, Other (non HMO) | Admitting: Family Medicine

## 2020-09-07 ENCOUNTER — Encounter: Payer: Self-pay | Admitting: Family Medicine

## 2020-09-07 ENCOUNTER — Other Ambulatory Visit: Payer: Self-pay

## 2020-09-07 VITALS — BP 122/70 | HR 68 | Temp 97.7°F | Ht 66.75 in | Wt 166.0 lb

## 2020-09-07 DIAGNOSIS — R002 Palpitations: Secondary | ICD-10-CM

## 2020-09-07 DIAGNOSIS — D500 Iron deficiency anemia secondary to blood loss (chronic): Secondary | ICD-10-CM | POA: Diagnosis not present

## 2020-09-07 DIAGNOSIS — Z0000 Encounter for general adult medical examination without abnormal findings: Secondary | ICD-10-CM | POA: Diagnosis not present

## 2020-09-07 DIAGNOSIS — M7501 Adhesive capsulitis of right shoulder: Secondary | ICD-10-CM

## 2020-09-07 NOTE — Assessment & Plan Note (Signed)
Doing much better  Finished PT

## 2020-09-07 NOTE — Assessment & Plan Note (Signed)
S/p eval and monitor and echo with cardiology  Dx with PVC  Does not want to take a beta blocker  Will continue to monitor

## 2020-09-07 NOTE — Assessment & Plan Note (Signed)
Reviewed health habits including diet and exercise and skin cancer prevention Reviewed appropriate screening tests for age  Also reviewed health mt list, fam hx and immunization status , as well as social and family history   See HPI Labs reviewed Sent for last pap and mam report from gyn (per pt utd) Has appt there soon as well  utd colonoscopy  Enc flu shot in the fall  Enc shingrix vaccine  covid immunized

## 2020-09-07 NOTE — Progress Notes (Signed)
Subjective:    Patient ID: Brooke Gill, female    DOB: November 18, 1970, 50 y.o.   MRN: [id number]  This visit occurred during the SARS-CoV-2 public health emergency.  Safety protocols were in place, including screening questions prior to the visit, additional usage of staff PPE, and extensive cleaning of exam room while observing appropriate contact time as indicated for disinfecting solutions.   HPI Here for health maintenance exam and to review chronic medical problems    Wt Readings from Last 3 Encounters:  09/07/20 166 lb (75.3 kg)  04/27/20 162 lb (73.5 kg)  04/04/20 162 lb (73.5 kg)   26.19 kg/m  Flu shot -enc in fall Covid immunized with booster Tdap 5/13 Zoster status -unsure if interested  Pap 5/18 , had one in sept 2021  H/o heavy menses - 5 plus days  Still has regular periods  Wants to avoid medication /hormones  Niferex 150 mg daily   Takes pnv daily    Mammogram 9/21 -gyn  Self breast exam - no lumps   Colonoscopy 3/22 with 5 y recall   BP Readings from Last 3 Encounters:  09/07/20 122/70  04/27/20 113/74  01/15/20 112/70   Pulse Readings from Last 3 Encounters:  09/07/20 68  04/27/20 65  01/15/20 84    Iron def anemia  Lab Results  Component Value Date   WBC 5.3 09/01/2020   HGB 10.3 (L) 09/01/2020   HCT 31.5 (L) 09/01/2020   MCV 84.4 09/01/2020   PLT 261.0 09/01/2020   Lab Results  Component Value Date   IRON 70 09/01/2020   FERRITIN 43.8 09/01/2020  Tolerates the iron supplement  Does not take it as regularly  Neg screen for [place] in the past   Cholesterol Lab Results  Component Value Date   CHOL 132 09/01/2020   CHOL 131 09/01/2019   CHOL 130 09/02/2018   Lab Results  Component Value Date   HDL 59.20 09/01/2020   HDL 56.70 09/01/2019   HDL 57.90 09/02/2018   Lab Results  Component Value Date   LDLCALC 65 09/01/2020   LDLCALC 65 09/01/2019   LDLCALC 61 09/02/2018   Lab Results  Component Value Date   TRIG 42.0 09/01/2020    TRIG 48.0 09/01/2019   TRIG 53.0 09/02/2018   Lab Results  Component Value Date   CHOLHDL 2 09/01/2020   CHOLHDL 2 09/01/2019   CHOLHDL 2 09/02/2018   No results found for: LDLDIRECT Diet is not great  Not enough fruits and veg - also does not like  Drinks lots of water   Walking twice daily for exercise   Other labs Lab Results  Component Value Date   CREATININE 0.88 09/01/2020   BUN 9 09/01/2020   NA 140 09/01/2020   K 4.0 09/01/2020   CL 107 09/01/2020   CO2 25 09/01/2020   Lab Results  Component Value Date   ALT 12 09/01/2020   AST 12 09/01/2020   ALKPHOS 79 09/01/2020   BILITOT 0.4 09/01/2020   Lab Results  Component Value Date   TSH 1.65 09/01/2020    Saw cardiology for palpitations  Monitor was ok  Had imaging  Follows up in January  PVC s on zio monitor   Px metoprolol but did not want to take it    Patient Active Problem List   Diagnosis Date Noted   Impingement syndrome of right shoulder 01/06/2020   Palpitations 01/06/2020   Adhesive capsulitis of right shoulder 12/22/2019  Colon cancer screening 09/07/2019   Stress reaction 03/04/2019   Anemia 09/02/2017   Routine general medical examination at a health care facility 06/19/2010   Past Medical History:  Diagnosis Date   Anemia    Past Surgical History:  Procedure Laterality Date   OVARIAN CYST SURGERY     Social History   Tobacco Use   Smoking status: Never   Smokeless tobacco: Never  Vaping Use   Vaping Use: Never used  Substance Use Topics   Alcohol use: No    Alcohol/week: 0.0 standard drinks   Drug use: No   Family History  Problem Relation Age of Onset   Alcohol abuse Father    Cancer Paternal Aunt        breast   Heart disease Paternal Grandmother    Cancer Paternal Grandfather        prostate CA   Colon cancer Paternal Grandfather    Colon polyps Neg Hx    Esophageal cancer Neg Hx    Rectal cancer Neg Hx    Stomach cancer Neg Hx    Allergies  Allergen  Reactions   Iron     GI side effect   Current Outpatient Medications on File Prior to Visit  Medication Sig Dispense Refill   famotidine (PEPCID) 40 MG tablet Take 1 tablet (40 mg total) by mouth daily. 30 tablet 5   iron polysaccharides (NIFEREX) 150 MG capsule Take 1 capsule (150 mg total) by mouth daily. With a meal 30 capsule 11   naproxen (NAPROSYN) 500 MG tablet Take 1 tablet (500 mg total) by mouth 2 (two) times daily with a meal. 60 tablet 0   PRENATAL VITAMINS PO Take one by mouth every other daily     No current facility-administered medications on file prior to visit.    Review of Systems  Constitutional:  Negative for activity change, appetite change, fatigue, fever and unexpected weight change.  HENT:  Negative for congestion, ear pain, rhinorrhea, sinus pressure and sore throat.   Eyes:  Negative for pain, redness and visual disturbance.  Respiratory:  Negative for cough, shortness of breath and wheezing.   Cardiovascular:  Positive for palpitations. Negative for chest pain and leg swelling.  Gastrointestinal:  Negative for abdominal pain, blood in stool, constipation and diarrhea.  Endocrine: Negative for polydipsia and polyuria.  Genitourinary:  Negative for dysuria, frequency and urgency.  Musculoskeletal:  Negative for arthralgias, back pain and myalgias.       Shoulder pain is improved  Skin:  Negative for pallor and rash.  Allergic/Immunologic: Negative for environmental allergies.  Neurological:  Negative for dizziness, syncope and headaches.  Hematological:  Negative for adenopathy. Does not bruise/bleed easily.  Psychiatric/Behavioral:  Negative for decreased concentration and dysphoric mood. The patient is not nervous/anxious.       Objective:   Physical Exam Constitutional:      General: She is not in acute distress.    Appearance: Normal appearance. She is well-developed and normal weight. She is not ill-appearing or diaphoretic.  HENT:     Head:  Normocephalic and atraumatic.     Right Ear: Tympanic membrane, ear canal and external ear normal.     Left Ear: Tympanic membrane, ear canal and external ear normal.     Nose: Nose normal. No congestion.     Mouth/Throat:     Mouth: Mucous membranes are moist.     Pharynx: Oropharynx is clear. No posterior oropharyngeal erythema.  Eyes:  General: No scleral icterus.    Extraocular Movements: Extraocular movements intact.     Conjunctiva/sclera: Conjunctivae normal.     Pupils: Pupils are equal, round, and reactive to light.  Neck:     Thyroid: No thyromegaly.     Vascular: No carotid bruit or JVD.  Cardiovascular:     Rate and Rhythm: Normal rate and regular rhythm.     Pulses: Normal pulses.     Heart sounds: Normal heart sounds.    No gallop.  Pulmonary:     Effort: Pulmonary effort is normal. No respiratory distress.     Breath sounds: Normal breath sounds. No wheezing.     Comments: Good air exch Chest:     Chest wall: No tenderness.  Abdominal:     General: Bowel sounds are normal. There is no distension or abdominal bruit.     Palpations: Abdomen is soft. There is no mass.     Tenderness: There is no abdominal tenderness.     Hernia: No hernia is present.  Genitourinary:    Comments: Breast and pelvic exam done at gyn office  Musculoskeletal:        General: No tenderness. Normal range of motion.     Cervical back: Normal range of motion and neck supple. No rigidity. No muscular tenderness.     Right lower leg: No edema.     Left lower leg: No edema.  Lymphadenopathy:     Cervical: No cervical adenopathy.  Skin:    General: Skin is warm and dry.     Coloration: Skin is not pale.     Findings: No erythema or rash.     Comments: Skin tags on neck/ small  Neurological:     Mental Status: She is alert. Mental status is at baseline.     Cranial Nerves: No cranial nerve deficit.     Motor: No abnormal muscle tone.     Coordination: Coordination normal.     Gait:  Gait normal.     Deep Tendon Reflexes: Reflexes are normal and symmetric. Reflexes normal.  Psychiatric:        Mood and Affect: Mood normal.        Cognition and Memory: Cognition and memory normal.          Assessment & Plan:   Problem List Items Addressed This Visit       Musculoskeletal and Integument   Adhesive capsulitis of right shoulder    Doing much better  Finished PT         Other   Routine general medical examination at a health care facility - Primary    Reviewed health habits including diet and exercise and skin cancer prevention Reviewed appropriate screening tests for age  Also reviewed health mt list, fam hx and immunization status , as well as social and family history   See HPI Labs reviewed Sent for last pap and mam report from gyn (per pt utd) Has appt there soon as well  utd colonoscopy  Enc flu shot in the fall  Enc shingrix vaccine  covid immunized        Anemia    Presumed due to heavy menses and iron def  Hb 10.3 but iron and ferritin levels nl  Urged to continue iron  Has gyn visit upcoming and utd colonoscopy  Does not want to take hormones for heavy menses  Enc her to continue the niferex 150 mg daily  Continue to follow Consider check for [facility]  and thal later if no improvement with menopause (per pt was screened for [facility] as a child)        Palpitations    S/p eval and monitor and echo with cardiology  Dx with PVC  Does not want to take a beta blocker  Will continue to monitor

## 2020-09-07 NOTE — Assessment & Plan Note (Signed)
Presumed due to heavy menses and iron def  Hb 10.3 but iron and ferritin levels nl  Urged to continue iron  Has gyn visit upcoming and utd colonoscopy  Does not want to take hormones for heavy menses  Enc her to continue the niferex 150 mg daily  Continue to follow Consider check for [place] and thal later if no improvement with menopause (per pt was screened for [facility] as a child)

## 2020-09-07 NOTE — Patient Instructions (Addendum)
Continue your iron   Take care of yourself   Get vitamin D over the counter and take 1000 iu daily   Stay active   Try some fruits vegetables   We will continue to watch your blood count  Follow up with gyn and cardiology

## 2021-01-31 ENCOUNTER — Other Ambulatory Visit: Payer: Self-pay | Admitting: Obstetrics and Gynecology

## 2021-01-31 DIAGNOSIS — Z1231 Encounter for screening mammogram for malignant neoplasm of breast: Secondary | ICD-10-CM

## 2021-02-17 ENCOUNTER — Ambulatory Visit
Admission: RE | Admit: 2021-02-17 | Discharge: 2021-02-17 | Disposition: A | Discharge status: Home / Self Care | Payer: 59 | Source: Ambulatory Visit | Attending: Obstetrics and Gynecology | Admitting: Obstetrics and Gynecology

## 2021-02-17 DIAGNOSIS — Z1231 Encounter for screening mammogram for malignant neoplasm of breast: Secondary | ICD-10-CM

## 2021-02-21 ENCOUNTER — Other Ambulatory Visit: Payer: Self-pay | Admitting: Obstetrics and Gynecology

## 2021-02-21 DIAGNOSIS — R928 Other abnormal and inconclusive findings on diagnostic imaging of breast: Secondary | ICD-10-CM

## 2021-03-27 ENCOUNTER — Ambulatory Visit: Payer: 59

## 2021-03-27 ENCOUNTER — Ambulatory Visit
Admission: RE | Admit: 2021-03-27 | Discharge: 2021-03-27 | Disposition: A | Discharge status: Home / Self Care | Payer: 59 | Source: Ambulatory Visit | Attending: Obstetrics and Gynecology | Admitting: Obstetrics and Gynecology

## 2021-03-27 DIAGNOSIS — R928 Other abnormal and inconclusive findings on diagnostic imaging of breast: Secondary | ICD-10-CM

## 2021-03-27 LAB — HM MAMMOGRAPHY

## 2021-03-27 LAB — HM PAP SMEAR: HM Pap smear: NORMAL

## 2021-09-04 ENCOUNTER — Telehealth: Payer: Self-pay | Admitting: Family Medicine

## 2021-09-04 DIAGNOSIS — D500 Iron deficiency anemia secondary to blood loss (chronic): Secondary | ICD-10-CM

## 2021-09-04 DIAGNOSIS — Z0000 Encounter for general adult medical examination without abnormal findings: Secondary | ICD-10-CM

## 2021-09-04 NOTE — Telephone Encounter (Signed)
-----   Message from Ellamae Sia sent at 08/21/2021  2:17 PM EDT ----- Regarding: Lab orders for Tuesday, 8.1.23 Patient is scheduled for CPX labs, please order future labs, Thanks , Karna Christmas

## 2021-09-05 ENCOUNTER — Other Ambulatory Visit: Payer: Managed Care, Other (non HMO)

## 2021-09-05 ENCOUNTER — Other Ambulatory Visit (INDEPENDENT_AMBULATORY_CARE_PROVIDER_SITE_OTHER): Payer: 59

## 2021-09-05 DIAGNOSIS — D500 Iron deficiency anemia secondary to blood loss (chronic): Secondary | ICD-10-CM | POA: Diagnosis not present

## 2021-09-05 DIAGNOSIS — Z0000 Encounter for general adult medical examination without abnormal findings: Secondary | ICD-10-CM | POA: Diagnosis not present

## 2021-09-05 LAB — CBC WITH DIFFERENTIAL/PLATELET
Basophils Absolute: 0 10*3/uL (ref 0.0–0.1)
Basophils Relative: 0.7 % (ref 0.0–3.0)
Eosinophils Absolute: 0.2 10*3/uL (ref 0.0–0.7)
Eosinophils Relative: 3 % (ref 0.0–5.0)
HCT: 31.8 % — ABNORMAL LOW (ref 36.0–46.0)
Hemoglobin: 10.5 g/dL — ABNORMAL LOW (ref 12.0–15.0)
Lymphocytes Relative: 31.6 % (ref 12.0–46.0)
Lymphs Abs: 2.2 10*3/uL (ref 0.7–4.0)
MCHC: 33.1 g/dL (ref 30.0–36.0)
MCV: 84.2 fl (ref 78.0–100.0)
Monocytes Absolute: 0.5 10*3/uL (ref 0.1–1.0)
Monocytes Relative: 6.7 % (ref 3.0–12.0)
Neutro Abs: 4 10*3/uL (ref 1.4–7.7)
Neutrophils Relative %: 58 % (ref 43.0–77.0)
Platelets: 257 10*3/uL (ref 150.0–400.0)
RBC: 3.78 Mil/uL — ABNORMAL LOW (ref 3.87–5.11)
RDW: 14.8 % (ref 11.5–15.5)
WBC: 6.9 10*3/uL (ref 4.0–10.5)

## 2021-09-05 LAB — LIPID PANEL
Cholesterol: 124 mg/dL (ref 0–200)
HDL: 52 mg/dL (ref >39.00)
LDL Cholesterol: 61 mg/dL (ref 0–99)
NonHDL: 71.87
Total CHOL/HDL Ratio: 2
Triglycerides: 52 mg/dL (ref 0.0–149.0)
VLDL: 10.4 mg/dL (ref 0.0–40.0)

## 2021-09-05 LAB — COMPREHENSIVE METABOLIC PANEL
ALT: 10 U/L (ref 0–35)
AST: 12 U/L (ref 0–37)
Albumin: 3.9 g/dL (ref 3.5–5.2)
Alkaline Phosphatase: 75 U/L (ref 39–117)
BUN: 8 mg/dL (ref 6–23)
CO2: 24 mEq/L (ref 19–32)
Calcium: 8.5 mg/dL (ref 8.4–10.5)
Chloride: 108 mEq/L (ref 96–112)
Creatinine, Ser: 0.89 mg/dL (ref 0.40–1.20)
GFR: 75.14 mL/min (ref >60.00)
Glucose, Bld: 93 mg/dL (ref 70–99)
Potassium: 3.7 mEq/L (ref 3.5–5.1)
Sodium: 139 mEq/L (ref 135–145)
Total Bilirubin: 0.3 mg/dL (ref 0.2–1.2)
Total Protein: 6.7 g/dL (ref 6.0–8.3)

## 2021-09-05 LAB — TSH: TSH: 2.17 u[IU]/mL (ref 0.35–5.50)

## 2021-09-05 LAB — IRON: Iron: 27 ug/dL — ABNORMAL LOW (ref 42–145)

## 2021-09-05 LAB — FERRITIN: Ferritin: 48.9 ng/mL (ref 10.0–291.0)

## 2021-09-12 ENCOUNTER — Encounter: Payer: Managed Care, Other (non HMO) | Admitting: Family Medicine

## 2021-09-12 NOTE — Progress Notes (Deleted)
   Subjective:    Patient ID: Brooke Gill, female    DOB: [birth year], 51 y.o.   MRN: [id number]  HPI Here for health maintenance exam and to review chronic medical problems    Immunization History  Administered Date(s) Administered   Influenza Whole 11/06/2007   Influenza,inj,Quad PF,6+ Mos 10/08/2012, 03/07/2017, 11/15/2019   Influenza-Unspecified 12/31/2020   Moderna Sars-Covid-2 Vaccination 04/13/2019, 05/11/2019, 12/13/2019   Td 02/06/2000   Tdap 06/19/2010   Health Maintenance Due  Topic Date Due   Hepatitis C Screening  Never done   COVID-19 Vaccine (4 - Moderna series) 02/07/2020   Zoster Vaccines- Shingrix (1 of 2) Never done   TETANUS/TDAP  06/18/2020   INFLUENZA VACCINE  09/05/2021   Sees gyn / had visit in December   Gets flu shot in fall   Shingrix  Tetanus shot is due /  had Tdap in 2022  Mammogram 02/2021, callback for asymmetry nl 03/27/21  Self breast exam  Pap 02/03/21 with hpv screen at gyn  Colonoscopy 04/2020 with 5 y recall    H/o anemia with heavy menses  Lab Results  Component Value Date   WBC 6.9 09/05/2021   HGB 10.5 (L) 09/05/2021   HCT 31.8 (L) 09/05/2021   MCV 84.2 09/05/2021   PLT 257.0 09/05/2021   Lab Results  Component Value Date   IRON 27 (L) 09/05/2021   FERRITIN 48.9 09/05/2021    Niferex 150 mg daily     Review of Systems     Objective:   Physical Exam        Assessment & Plan:

## 2021-11-27 ENCOUNTER — Encounter: Payer: Self-pay | Admitting: Family Medicine

## 2021-11-27 ENCOUNTER — Ambulatory Visit (INDEPENDENT_AMBULATORY_CARE_PROVIDER_SITE_OTHER): Payer: 59 | Admitting: Family Medicine

## 2021-11-27 VITALS — BP 106/76 | HR 69 | Temp 97.3°F | Ht 66.25 in | Wt 169.5 lb

## 2021-11-27 DIAGNOSIS — Z0000 Encounter for general adult medical examination without abnormal findings: Secondary | ICD-10-CM

## 2021-11-27 DIAGNOSIS — D500 Iron deficiency anemia secondary to blood loss (chronic): Secondary | ICD-10-CM | POA: Diagnosis not present

## 2021-11-27 DIAGNOSIS — Z23 Encounter for immunization: Secondary | ICD-10-CM | POA: Diagnosis not present

## 2021-11-27 DIAGNOSIS — Z1211 Encounter for screening for malignant neoplasm of colon: Secondary | ICD-10-CM | POA: Diagnosis not present

## 2021-11-27 MED ORDER — POLYSACCHARIDE IRON COMPLEX 150 MG PO CAPS: 150.0000 mg | ORAL_CAPSULE | Freq: Every day | ORAL | 3 refills | Status: DC

## 2021-11-27 MED ORDER — FAMOTIDINE 40 MG PO TABS: 40.0000 mg | ORAL_TABLET | Freq: Every day | ORAL | 3 refills | Status: DC

## 2021-11-27 NOTE — Assessment & Plan Note (Signed)
Reviewed health habits including diet and exercise and skin cancer prevention Reviewed appropriate screening tests for age  Also reviewed health mt list, fam hx and immunization status , as well as social and family history   See HPI Labs reviewed Flu shot given Plans to get shingrix if covered  Gyn care utd with pap  Mammogram utd 02/2021  Colonoscopy utd 04/2020 with 5 y recall

## 2021-11-27 NOTE — Assessment & Plan Note (Signed)
Hb 10.5 with iron level of 27 Likely from menses  May be worsening fatigue and palpitations Urged to get back on niferex

## 2021-11-27 NOTE — Assessment & Plan Note (Signed)
Colonoscopy 04/2020 with 5 y recall

## 2021-11-27 NOTE — Progress Notes (Signed)
Subjective:    Patient ID: Brooke Gill, female    DOB: 21-Jun-1970, 51 y.o.   MRN: [id number]  HPI Here for health maintenance exam and to review chronic medical problems    Wt Readings from Last 3 Encounters:  11/27/21 169 lb 8 oz (76.9 kg)  09/07/20 166 lb (75.3 kg)  04/27/20 162 lb (73.5 kg)   27.15 kg/m  Doing ok  Works a lot  Feeling ok   Heart still flutters occ   Stress level is actually not too bad  Self care - is fair   Wants to start exercising  Tried to add walking, that is not helping  Did some free workout sessions [place]  Wants to try spin bar  Likes spinning     Immunization History  Administered Date(s) Administered   Influenza Whole 11/06/2007   Influenza,inj,Quad PF,6+ Mos 10/08/2012, 03/07/2017, 11/15/2019   Influenza-Unspecified 12/31/2020   Moderna Sars-Covid-2 Vaccination 04/13/2019, 05/11/2019, 12/13/2019   Td 02/06/2000   Tdap 06/19/2010   Health Maintenance Due  Topic Date Due   Hepatitis C Screening  Never done   COVID-19 Vaccine (4 - Moderna series) 02/07/2020   Zoster Vaccines- Shingrix (1 of 2) Never done   TETANUS/TDAP  06/18/2020   INFLUENZA VACCINE  09/05/2021   Shingrix : interested   Tdap 06/2010- due for   Flu shot : today   Gyn care 01/2021, had pap , it was normal   Mammogram 02/2021 , asymmetry R breast had call back for this and it was nl  Self breast exam : no lumps    Colonoscopy 04/2020 with 5 y recall   BP Readings from Last 3 Encounters:  11/27/21 106/76  09/07/20 122/70  04/27/20 113/74   Pulse Readings from Last 3 Encounters:  11/27/21 69  09/07/20 68  04/27/20 65    H/o anemia  Lab Results  Component Value Date   WBC 6.9 09/05/2021   HGB 10.5 (L) 09/05/2021   HCT 31.8 (L) 09/05/2021   MCV 84.2 09/05/2021   PLT 257.0 09/05/2021  Still regular and heavy menses     Lab Results  Component Value Date   IRON 27 (L) 09/05/2021   FERRITIN 48.9 09/05/2021   Cholesterol Lab Results   Component Value Date   CHOL 124 09/05/2021   CHOL 132 09/01/2020   CHOL 131 09/01/2019   Lab Results  Component Value Date   HDL 52.00 09/05/2021   HDL 59.20 09/01/2020   HDL 56.70 09/01/2019   Lab Results  Component Value Date   LDLCALC 61 09/05/2021   LDLCALC 65 09/01/2020   LDLCALC 65 09/01/2019   Lab Results  Component Value Date   TRIG 52.0 09/05/2021   TRIG 42.0 09/01/2020   TRIG 48.0 09/01/2019   Lab Results  Component Value Date   CHOLHDL 2 09/05/2021   CHOLHDL 2 09/01/2020   CHOLHDL 2 09/01/2019   No results found for: "LDLDIRECT"  Lab Results  Component Value Date   CREATININE 0.89 09/05/2021   BUN 8 09/05/2021   NA 139 09/05/2021   K 3.7 09/05/2021   CL 108 09/05/2021   CO2 24 09/05/2021   Lab Results  Component Value Date   ALT 10 09/05/2021   AST 12 09/05/2021   ALKPHOS 75 09/05/2021   BILITOT 0.3 09/05/2021   Lab Results  Component Value Date   TSH 2.17 09/05/2021   Patient Active Problem List   Diagnosis Date Noted   Impingement syndrome of right  shoulder 01/06/2020   Palpitations 01/06/2020   Adhesive capsulitis of right shoulder 12/22/2019   Colon cancer screening 09/07/2019   Stress reaction 03/04/2019   Anemia 09/02/2017   Routine general medical examination at a health care facility 06/19/2010   Past Medical History:  Diagnosis Date   Anemia    Past Surgical History:  Procedure Laterality Date   OVARIAN CYST SURGERY     Social History   Tobacco Use   Smoking status: Never   Smokeless tobacco: Never  Vaping Use   Vaping Use: Never used  Substance Use Topics   Alcohol use: No    Alcohol/week: 0.0 standard drinks of alcohol   Drug use: No   Family History  Problem Relation Age of Onset   Alcohol abuse Father    Cancer Paternal Aunt        breast   Heart disease Paternal Grandmother    Cancer Paternal Grandfather        prostate CA   Colon cancer Paternal Grandfather    Colon polyps Neg Hx    Esophageal  cancer Neg Hx    Rectal cancer Neg Hx    Stomach cancer Neg Hx    Allergies  Allergen Reactions   Iron     GI side effect   Current Outpatient Medications on File Prior to Visit  Medication Sig Dispense Refill   naproxen (NAPROSYN) 500 MG tablet Take 1 tablet (500 mg total) by mouth 2 (two) times daily with a meal. 60 tablet 0   PRENATAL VITAMINS PO Take one by mouth every other daily     No current facility-administered medications on file prior to visit.    Review of Systems  Constitutional:  Positive for fatigue. Negative for activity change, appetite change, fever and unexpected weight change.  HENT:  Negative for congestion, ear pain, rhinorrhea, sinus pressure and sore throat.   Eyes:  Negative for pain, redness and visual disturbance.  Respiratory:  Negative for cough, shortness of breath and wheezing.   Cardiovascular:  Positive for palpitations. Negative for chest pain and leg swelling.  Gastrointestinal:  Negative for abdominal pain, blood in stool, constipation and diarrhea.  Endocrine: Negative for polydipsia and polyuria.  Genitourinary:  Negative for dysuria, frequency and urgency.  Musculoskeletal:  Negative for arthralgias, back pain and myalgias.  Skin:  Negative for pallor and rash.  Allergic/Immunologic: Negative for environmental allergies.  Neurological:  Negative for dizziness, syncope and headaches.  Hematological:  Negative for adenopathy. Does not bruise/bleed easily.  Psychiatric/Behavioral:  Negative for decreased concentration and dysphoric mood. The patient is not nervous/anxious.        Objective:   Physical Exam Constitutional:      General: She is not in acute distress.    Appearance: Normal appearance. She is well-developed. She is not ill-appearing or diaphoretic.  HENT:     Head: Normocephalic and atraumatic.     Right Ear: Tympanic membrane, ear canal and external ear normal.     Left Ear: Tympanic membrane, ear canal and external ear  normal.     Nose: Nose normal. No congestion.     Mouth/Throat:     Mouth: Mucous membranes are moist.     Pharynx: Oropharynx is clear. No posterior oropharyngeal erythema.  Eyes:     General: No scleral icterus.    Extraocular Movements: Extraocular movements intact.     Conjunctiva/sclera: Conjunctivae normal.     Pupils: Pupils are equal, round, and reactive to light.  Neck:  Thyroid: No thyromegaly.     Vascular: No carotid bruit or JVD.  Cardiovascular:     Rate and Rhythm: Normal rate and regular rhythm.     Pulses: Normal pulses.     Heart sounds: Normal heart sounds.     No gallop.  Pulmonary:     Effort: Pulmonary effort is normal. No respiratory distress.     Breath sounds: Normal breath sounds. No wheezing.     Comments: Good air exch Chest:     Chest wall: No tenderness.  Abdominal:     General: Bowel sounds are normal. There is no distension or abdominal bruit.     Palpations: Abdomen is soft. There is no mass.     Tenderness: There is no abdominal tenderness.     Hernia: No hernia is present.  Genitourinary:    Comments: Breast and pelvic exam done by gyn provider  Musculoskeletal:        General: No tenderness. Normal range of motion.     Cervical back: Normal range of motion and neck supple. No rigidity. No muscular tenderness.     Right lower leg: No edema.     Left lower leg: No edema.     Comments: No kyphosis   Lymphadenopathy:     Cervical: No cervical adenopathy.  Skin:    General: Skin is warm and dry.     Coloration: Skin is not pale.     Findings: No erythema or rash.     Comments: Some lentigines  Neurological:     Mental Status: She is alert. Mental status is at baseline.     Cranial Nerves: No cranial nerve deficit.     Motor: No abnormal muscle tone.     Coordination: Coordination normal.     Gait: Gait normal.     Deep Tendon Reflexes: Reflexes are normal and symmetric. Reflexes normal.  Psychiatric:        Mood and Affect: Mood  normal.        Cognition and Memory: Cognition and memory normal.           Assessment & Plan:   Problem List Items Addressed This Visit       Other   Anemia    Hb 10.5 with iron level of 27 Likely from menses  May be worsening fatigue and palpitations Urged to get back on niferex        Relevant Medications   iron polysaccharides (NIFEREX) 150 MG capsule   Colon cancer screening    Colonoscopy 04/2020 with 5 y recall       Routine general medical examination at a health care facility - Primary    Reviewed health habits including diet and exercise and skin cancer prevention Reviewed appropriate screening tests for age  Also reviewed health mt list, fam hx and immunization status , as well as social and family history   See HPI Labs reviewed Flu shot given Plans to get shingrix if covered  Gyn care utd with pap  Mammogram utd 02/2021  Colonoscopy utd 04/2020 with 5 y recall       Relevant Orders   Flu Vaccine QUAD 6+ mos PF IM (Fluarix Quad PF) (Completed)   Other Visit Diagnoses     Need for influenza vaccination       Relevant Orders   Flu Vaccine QUAD 6+ mos PF IM (Fluarix Quad PF) (Completed)

## 2021-11-27 NOTE — Patient Instructions (Addendum)
Try spinning for exercise  Add some resistance/strength training in the future   If you are interested in the new shingles vaccine (Shingrix) - call your local pharmacy to check on coverage and availability  If affordable, get on a wait list at your pharmacy to get the vaccine.  Please get back on your iron regularly  You will feel better  You may have less palpitations  Get stool softener or miralax for constipation as needed

## 2022-01-09 ENCOUNTER — Encounter: Payer: Self-pay | Admitting: Family Medicine

## 2022-11-20 ENCOUNTER — Telehealth: Payer: Self-pay | Admitting: Family Medicine

## 2022-11-20 DIAGNOSIS — D500 Iron deficiency anemia secondary to blood loss (chronic): Secondary | ICD-10-CM

## 2022-11-20 DIAGNOSIS — Z0000 Encounter for general adult medical examination without abnormal findings: Secondary | ICD-10-CM

## 2022-11-20 NOTE — Telephone Encounter (Signed)
-----   Message from Alvina Chou sent at 11/05/2022  3:31 PM EDT ----- Regarding: Lab orders for [facility], 10.17.24 Patient is scheduled for CPX labs, please order future labs, Thanks , Camelia Eng

## 2022-11-22 ENCOUNTER — Other Ambulatory Visit: Payer: 59

## 2022-11-23 ENCOUNTER — Other Ambulatory Visit (INDEPENDENT_AMBULATORY_CARE_PROVIDER_SITE_OTHER): Payer: 59

## 2022-11-23 DIAGNOSIS — Z0000 Encounter for general adult medical examination without abnormal findings: Secondary | ICD-10-CM

## 2022-11-23 DIAGNOSIS — D500 Iron deficiency anemia secondary to blood loss (chronic): Secondary | ICD-10-CM

## 2022-11-23 LAB — COMPREHENSIVE METABOLIC PANEL
ALT: 8 U/L (ref 0–35)
AST: 12 U/L (ref 0–37)
Albumin: 3.9 g/dL (ref 3.5–5.2)
Alkaline Phosphatase: 77 U/L (ref 39–117)
BUN: 10 mg/dL (ref 6–23)
CO2: 23 meq/L (ref 19–32)
Calcium: 8.8 mg/dL (ref 8.4–10.5)
Chloride: 106 meq/L (ref 96–112)
Creatinine, Ser: 0.91 mg/dL (ref 0.40–1.20)
GFR: 72.54 mL/min (ref >60.00)
Glucose, Bld: 91 mg/dL (ref 70–99)
Potassium: 4 meq/L (ref 3.5–5.1)
Sodium: 139 meq/L (ref 135–145)
Total Bilirubin: 0.5 mg/dL (ref 0.2–1.2)
Total Protein: 6.6 g/dL (ref 6.0–8.3)

## 2022-11-23 LAB — LIPID PANEL
Cholesterol: 121 mg/dL (ref 0–200)
HDL: 55.8 mg/dL (ref >39.00)
LDL Cholesterol: 55 mg/dL (ref 0–99)
NonHDL: 65.32
Total CHOL/HDL Ratio: 2
Triglycerides: 50 mg/dL (ref 0.0–149.0)
VLDL: 10 mg/dL (ref 0.0–40.0)

## 2022-11-23 LAB — CBC WITH DIFFERENTIAL/PLATELET
Basophils Absolute: 0.1 10*3/uL (ref 0.0–0.1)
Basophils Relative: 0.7 % (ref 0.0–3.0)
Eosinophils Absolute: 0.1 10*3/uL (ref 0.0–0.7)
Eosinophils Relative: 1.7 % (ref 0.0–5.0)
HCT: 33.2 % — ABNORMAL LOW (ref 36.0–46.0)
Hemoglobin: 10.7 g/dL — ABNORMAL LOW (ref 12.0–15.0)
Lymphocytes Relative: 29.2 % (ref 12.0–46.0)
Lymphs Abs: 2.3 10*3/uL (ref 0.7–4.0)
MCHC: 32.2 g/dL (ref 30.0–36.0)
MCV: 86.8 fL (ref 78.0–100.0)
Monocytes Absolute: 0.6 10*3/uL (ref 0.1–1.0)
Monocytes Relative: 7.8 % (ref 3.0–12.0)
Neutro Abs: 4.8 10*3/uL (ref 1.4–7.7)
Neutrophils Relative %: 60.6 % (ref 43.0–77.0)
Platelets: 236 10*3/uL (ref 150.0–400.0)
RBC: 3.82 Mil/uL — ABNORMAL LOW (ref 3.87–5.11)
RDW: 14.9 % (ref 11.5–15.5)
WBC: 7.9 10*3/uL (ref 4.0–10.5)

## 2022-11-23 LAB — TSH: TSH: 2.49 u[IU]/mL (ref 0.35–5.50)

## 2022-11-23 LAB — IRON: Iron: 39 ug/dL — ABNORMAL LOW (ref 42–145)

## 2022-11-29 ENCOUNTER — Encounter: Payer: 59 | Admitting: Family Medicine

## 2022-12-10 NOTE — Progress Notes (Unsigned)
Subjective:    Patient ID: Brooke Gill, female    DOB: 01/15/1971, 52 y.o.   MRN: [id number]  HPI  Here for health maintenance exam and to review chronic medical problems   Wt Readings from Last 3 Encounters:  12/11/22 173 lb 4 oz (78.6 kg)  11/27/21 169 lb 8 oz (76.9 kg)  09/07/20 166 lb (75.3 kg)   27.96 kg/m  Vitals:   12/11/22 1406  BP: 108/66  Pulse: 80  Temp: 97.9 F (36.6 C)  SpO2: 100%    Immunization History  Administered Date(s) Administered   Influenza Whole 11/06/2007   Influenza, Seasonal, Injecte, Preservative Fre 12/11/2022   Influenza,inj,Quad PF,6+ Mos 10/08/2012, 03/07/2017, 11/15/2019, 11/27/2021   Influenza-Unspecified 12/31/2020   Moderna Sars-Covid-2 Vaccination 04/13/2019, 05/11/2019, 12/13/2019   Td 02/06/2000, 12/11/2022   Tdap 06/19/2010    Health Maintenance Due  Topic Date Due   Hepatitis C Screening  Never done   MAMMOGRAM  03/27/2022   Feeling good    Tetanus shot -wants today  Flu shot -wants today  Shingrix shots -has not checked on coverage yet   Mammogram 03/2021 - had last one at gyn is up to date /normal  Self breast exam- no changes or lumps   Gyn health Pap 03/2021 normal at gyn  Had one more recently  Sees gyn and sent for info   Periods are still coming  A little lighter  Still perimenopausal  Occational hot flashes - but she is cold natured    Colon cancer screening -colonoscopy 04/2020 with 5 y recall   Bone health   Falls-none Fractures-none  Supplements -none  Is lactose into - can do lactose free dairy  Exercise  Walks her dog    Mood    11/27/2021   10:07 AM 09/07/2020    4:02 PM 09/07/2019    3:53 PM 09/05/2018    4:33 PM 09/02/2017    8:56 AM  Depression screen PHQ 2/9  Decreased Interest 0 0 0 1 1  Down, Depressed, Hopeless 0 0 0 0 1  PHQ - 2 Score 0 0 0 1 2  Altered sleeping 1 1 1 1 1   Tired, decreased energy 1 1 1 1 1   Change in appetite 0 0 0 0 0  Feeling bad or failure about  yourself  0 0 0 0 0  Trouble concentrating 1 0 0 0 0  Moving slowly or fidgety/restless 0 0 0 0 0  Suicidal thoughts 0 0 0 0 0  PHQ-9 Score 3 2 2 3 4   Difficult doing work/chores  Not difficult at all Not difficult at all     Anemia  Lab Results  Component Value Date   WBC 7.9 11/23/2022   HGB 10.7 (L) 11/23/2022   HCT 33.2 (L) 11/23/2022   MCV 86.8 11/23/2022   PLT 236.0 11/23/2022   Lab Results  Component Value Date   IRON 39 (L) 11/23/2022   FERRITIN 48.9 09/05/2021  From menses in past  Niferex 150 mg daily (she is taking three times weekly)  She does take B12 every day   No [place] in the family  Unsure if she was tested       Cholesterol Lab Results  Component Value Date   CHOL 121 11/23/2022   CHOL 124 09/05/2021   CHOL 132 09/01/2020   Lab Results  Component Value Date   HDL 55.80 11/23/2022   HDL 52.00 09/05/2021   HDL 59.20 09/01/2020   Lab  Results  Component Value Date   LDLCALC 55 11/23/2022   LDLCALC 61 09/05/2021   LDLCALC 65 09/01/2020   Lab Results  Component Value Date   TRIG 50.0 11/23/2022   TRIG 52.0 09/05/2021   TRIG 42.0 09/01/2020   Lab Results  Component Value Date   CHOLHDL 2 11/23/2022   CHOLHDL 2 09/05/2021   CHOLHDL 2 09/01/2020   No results found for: "LDLDIRECT"  Diet could also be better    Other labs Lab Results  Component Value Date   NA 139 11/23/2022   K 4.0 11/23/2022   CO2 23 11/23/2022   GLUCOSE 91 11/23/2022   BUN 10 11/23/2022   CREATININE 0.91 11/23/2022   CALCIUM 8.8 11/23/2022   GFR 72.54 11/23/2022   GFRNONAA >60 09/16/2019   Lab Results  Component Value Date   ALT 8 11/23/2022   AST 12 11/23/2022   ALKPHOS 77 11/23/2022   BILITOT 0.5 11/23/2022   Lab Results  Component Value Date   TSH 2.49 11/23/2022      Patient Active Problem List   Diagnosis Date Noted   GERD (gastroesophageal reflux disease) 12/11/2022   Palpitations 01/06/2020   Colon cancer screening 09/07/2019   Stress  reaction 03/04/2019   Anemia 09/02/2017   Routine general medical examination at a health care facility 06/19/2010   Past Medical History:  Diagnosis Date   Anemia    Past Surgical History:  Procedure Laterality Date   OVARIAN CYST SURGERY     Social History   Tobacco Use   Smoking status: Never   Smokeless tobacco: Never  Vaping Use   Vaping status: Never Used  Substance Use Topics   Alcohol use: No   Drug use: No   Family History  Problem Relation Age of Onset   Alcohol abuse Father    Cancer Paternal Aunt        breast   Heart disease Paternal Grandmother    Cancer Paternal Grandfather        prostate CA   Colon cancer Paternal Grandfather    Colon polyps Neg Hx    Esophageal cancer Neg Hx    Rectal cancer Neg Hx    Stomach cancer Neg Hx    Allergies  Allergen Reactions   Iron     GI side effect   Current Outpatient Medications on File Prior to Visit  Medication Sig Dispense Refill   naproxen (NAPROSYN) 500 MG tablet Take 1 tablet (500 mg total) by mouth 2 (two) times daily with a meal. 60 tablet 0   PRENATAL VITAMINS PO Take one by mouth every other daily     No current facility-administered medications on file prior to visit.    Review of Systems  Constitutional:  Positive for fatigue. Negative for activity change, appetite change, fever and unexpected weight change.  HENT:  Negative for congestion, ear pain, rhinorrhea, sinus pressure and sore throat.   Eyes:  Negative for pain, redness and visual disturbance.  Respiratory:  Negative for cough, shortness of breath and wheezing.   Cardiovascular:  Negative for chest pain and palpitations.  Gastrointestinal:  Negative for abdominal pain, blood in stool, constipation and diarrhea.  Endocrine: Negative for polydipsia and polyuria.  Genitourinary:  Negative for dysuria, frequency and urgency.  Musculoskeletal:  Negative for arthralgias, back pain and myalgias.  Skin:  Negative for pallor and rash.   Allergic/Immunologic: Negative for environmental allergies.  Neurological:  Negative for dizziness, syncope and headaches.  Hematological:  Negative  for adenopathy. Does not bruise/bleed easily.  Psychiatric/Behavioral:  Negative for decreased concentration and dysphoric mood. The patient is not nervous/anxious.        Objective:   Physical Exam Constitutional:      General: She is not in acute distress.    Appearance: Normal appearance. She is well-developed and normal weight. She is not ill-appearing or diaphoretic.  HENT:     Head: Normocephalic and atraumatic.     Right Ear: Tympanic membrane, ear canal and external ear normal.     Left Ear: Tympanic membrane, ear canal and external ear normal.     Nose: Nose normal. No congestion.     Mouth/Throat:     Mouth: Mucous membranes are moist.     Pharynx: Oropharynx is clear. No posterior oropharyngeal erythema.  Eyes:     General: No scleral icterus.    Extraocular Movements: Extraocular movements intact.     Conjunctiva/sclera: Conjunctivae normal.     Pupils: Pupils are equal, round, and reactive to light.  Neck:     Thyroid: No thyromegaly.     Vascular: No carotid bruit or JVD.  Cardiovascular:     Rate and Rhythm: Normal rate and regular rhythm.     Pulses: Normal pulses.     Heart sounds: Normal heart sounds.     No gallop.  Pulmonary:     Effort: Pulmonary effort is normal. No respiratory distress.     Breath sounds: Normal breath sounds. No wheezing.     Comments: Good air exch Chest:     Chest wall: No tenderness.  Abdominal:     General: Bowel sounds are normal. There is no distension or abdominal bruit.     Palpations: Abdomen is soft. There is no mass.     Tenderness: There is no abdominal tenderness.     Hernia: No hernia is present.  Genitourinary:    Comments: Breast and pelvic exam are done by gyn provider   Musculoskeletal:        General: No tenderness. Normal range of motion.     Cervical back:  Normal range of motion and neck supple. No rigidity. No muscular tenderness.     Right lower leg: No edema.     Left lower leg: No edema.     Comments: No kyphosis   Lymphadenopathy:     Cervical: No cervical adenopathy.  Skin:    General: Skin is warm and dry.     Coloration: Skin is not pale.     Findings: No erythema or rash.  Neurological:     Mental Status: She is alert. Mental status is at baseline.     Cranial Nerves: No cranial nerve deficit.     Motor: No abnormal muscle tone.     Coordination: Coordination normal.     Gait: Gait normal.     Deep Tendon Reflexes: Reflexes are normal and symmetric. Reflexes normal.  Psychiatric:        Mood and Affect: Mood normal.        Cognition and Memory: Cognition and memory normal.           Assessment & Plan:   Problem List Items Addressed This Visit       Digestive   GERD (gastroesophageal reflux disease)    Takes pepcid 40 mg daily prn Encouraged to watch diet       Relevant Medications   famotidine (PEPCID) 40 MG tablet     Other   Anemia  Stable Hb 10.7 Likely from heavy menses  Sees gyn  Not as compliant with niferex  Will have her take this daily - re check in 1 mo and if not improved ref to hematology  May need IV iron or further eval  No past history of [place] trait or thal       Relevant Medications   iron polysaccharides (NIFEREX) 150 MG capsule   Colon cancer screening    Colonoscopy utd 04/2020 with 5 y recall      Routine general medical examination at a health care facility - Primary    Reviewed health habits including diet and exercise and skin cancer prevention Reviewed appropriate screening tests for age  Also reviewed health mt list, fam hx and immunization status , as well as social and family history   See HPI Labs reviewed and ordered Td and flu shots given today  Pt plans to check on coverage of shingrix  Pap 03/2021 - sent for most recent pap and mammo from gyn in past year   Discussed fall prevention, supplements and exercise for bone density  PHQ of 3 (fatigue) -likely perimenopause related       Other Visit Diagnoses     Need for influenza vaccination       Relevant Orders   Flu vaccine trivalent PF, 6mos and older(Flulaval,Afluria,Fluarix,Fluzone) (Completed)   Need for Td vaccine       Relevant Orders   Td : Tetanus/diphtheria >7yo Preservative  free (Completed)

## 2022-12-11 ENCOUNTER — Ambulatory Visit: Payer: 59 | Admitting: Family Medicine

## 2022-12-11 VITALS — BP 108/66 | HR 80 | Temp 97.9°F | Ht 66.0 in | Wt 173.2 lb

## 2022-12-11 DIAGNOSIS — Z0000 Encounter for general adult medical examination without abnormal findings: Secondary | ICD-10-CM

## 2022-12-11 DIAGNOSIS — Z23 Encounter for immunization: Secondary | ICD-10-CM | POA: Diagnosis not present

## 2022-12-11 DIAGNOSIS — D500 Iron deficiency anemia secondary to blood loss (chronic): Secondary | ICD-10-CM

## 2022-12-11 DIAGNOSIS — K219 Gastro-esophageal reflux disease without esophagitis: Secondary | ICD-10-CM | POA: Diagnosis not present

## 2022-12-11 DIAGNOSIS — Z1211 Encounter for screening for malignant neoplasm of colon: Secondary | ICD-10-CM

## 2022-12-11 MED ORDER — FAMOTIDINE 40 MG PO TABS: 40.0000 mg | ORAL_TABLET | Freq: Every day | ORAL | 3 refills | Status: AC

## 2022-12-11 MED ORDER — POLYSACCHARIDE IRON COMPLEX 150 MG PO CAPS: 150.0000 mg | ORAL_CAPSULE | Freq: Every day | ORAL | 3 refills | Status: AC

## 2022-12-11 NOTE — Assessment & Plan Note (Signed)
Reviewed health habits including diet and exercise and skin cancer prevention Reviewed appropriate screening tests for age  Also reviewed health mt list, fam hx and immunization status , as well as social and family history   See HPI Labs reviewed and ordered Td and flu shots given today  Pt plans to check on coverage of shingrix  Pap 03/2021 - sent for most recent pap and mammo from gyn in past year  Discussed fall prevention, supplements and exercise for bone density  PHQ of 3 (fatigue) -likely perimenopause related

## 2022-12-11 NOTE — Assessment & Plan Note (Signed)
Takes pepcid 40 mg daily prn Encouraged to watch diet

## 2022-12-11 NOTE — Patient Instructions (Addendum)
If you are interested in the shingles vaccine series (Shingrix), call your insurance or pharmacy to check on coverage and location it must be given.  If affordable - you can schedule it here or at your pharmacy depending on coverage   Flu shot today Tetanus shot today   Try taking iron every day  Let's re check labs in 1 month  If not improving then hematology referral will be planned   Start vitamin D3  2000 international units daily  If you can take calcium -this is good also  Otherwise get calcium from diet   Keep walking Add some strength training to your routine, this is important for bone and brain health and can reduce your risk of falls and help your body use insulin properly and regulate weight  Light weights, exercise bands , and internet videos are a good way to start  Yoga (chair or regular), machines , floor exercises or a gym with machines are also good options    To avoid menopause weight gain  Strength train  Try to get most of your carbohydrates from produce (with the exception of white potatoes) and whole grains Eat less bread/pasta/rice/snack foods/cereals/sweets and other items from the middle of the grocery store (processed carbs)

## 2022-12-11 NOTE — Assessment & Plan Note (Signed)
Colonoscopy utd 04/2020 with 5 y recall

## 2022-12-11 NOTE — Assessment & Plan Note (Signed)
Stable Hb 10.7 Likely from heavy menses  Sees gyn  Not as compliant with niferex  Will have her take this daily - re check in 1 mo and if not improved ref to hematology  May need IV iron or further eval  No past history of [place] trait or thal

## 2023-01-07 ENCOUNTER — Telehealth: Payer: Self-pay | Admitting: Family Medicine

## 2023-01-07 DIAGNOSIS — D500 Iron deficiency anemia secondary to blood loss (chronic): Secondary | ICD-10-CM

## 2023-01-07 NOTE — Telephone Encounter (Signed)
-----   Message from Alvina Chou sent at 12/27/2022 12:48 PM EST ----- Regarding: Lab orders for [place], 12.5.24 Lab orders, thanks

## 2023-01-10 ENCOUNTER — Other Ambulatory Visit: Payer: 59

## 2023-02-12 ENCOUNTER — Other Ambulatory Visit: Payer: 59

## 2023-12-05 ENCOUNTER — Encounter: Payer: Self-pay | Admitting: Family Medicine

## 2023-12-05 ENCOUNTER — Ambulatory Visit: Admitting: Family Medicine

## 2023-12-05 VITALS — BP 110/68 | HR 78 | Temp 98.3°F | Ht 66.0 in | Wt 185.0 lb

## 2023-12-05 DIAGNOSIS — Z23 Encounter for immunization: Secondary | ICD-10-CM | POA: Diagnosis not present

## 2023-12-05 DIAGNOSIS — L989 Disorder of the skin and subcutaneous tissue, unspecified: Secondary | ICD-10-CM | POA: Insufficient documentation

## 2023-12-05 NOTE — Progress Notes (Signed)
 Subjective:    Patient ID: Brooke Gill, female    DOB: 03-07-1970, 53 y.o.   MRN: [id number]  HPI  Wt Readings from Last 3 Encounters:  12/05/23 185 lb (83.9 kg)  12/11/22 173 lb 4 oz (78.6 kg)  11/27/21 169 lb 8 oz (76.9 kg)   29.86 kg/m  Vitals:   12/05/23 1127  BP: 110/68  Pulse: 78  Temp: 98.3 F (36.8 C)  SpO2: 100%    Pt presents for c/o  Skin bump on her back for a month Flu shot    Just popped up  Some itchiness  Has scratched it   Wend to [place]  recently -had some sun  Usually not a lot of sun      Patient Active Problem List   Diagnosis Date Noted   Skin lesion 12/05/2023   GERD (gastroesophageal reflux disease) 12/11/2022   Palpitations 01/06/2020   Colon cancer screening 09/07/2019   Stress reaction 03/04/2019   Anemia 09/02/2017   Routine general medical examination at a health care facility 06/19/2010   Past Medical History:  Diagnosis Date   Anemia    Past Surgical History:  Procedure Laterality Date   OVARIAN CYST SURGERY     Social History   Tobacco Use   Smoking status: Never   Smokeless tobacco: Never  Vaping Use   Vaping status: Never Used  Substance Use Topics   Alcohol use: No   Drug use: No   Family History  Problem Relation Age of Onset   Alcohol abuse Father    Cancer Paternal Aunt        breast   Heart disease Paternal Grandmother    Cancer Paternal Grandfather        prostate CA   Colon cancer Paternal Grandfather    Colon polyps Neg Hx    Esophageal cancer Neg Hx    Rectal cancer Neg Hx    Stomach cancer Neg Hx    Allergies  Allergen Reactions   Iron      GI side effect   Current Outpatient Medications on File Prior to Visit  Medication Sig Dispense Refill   famotidine  (PEPCID ) 40 MG tablet Take 1 tablet (40 mg total) by mouth daily. 90 tablet 3   iron  polysaccharides (NIFEREX) 150 MG capsule Take 1 capsule (150 mg total) by mouth daily. With a meal 90 capsule 3   naproxen  (NAPROSYN ) 500 MG tablet  Take 1 tablet (500 mg total) by mouth 2 (two) times daily with a meal. 60 tablet 0   PRENATAL VITAMINS PO Take one by mouth every other daily     No current facility-administered medications on file prior to visit.    Review of Systems  Constitutional:  Negative for activity change, appetite change, fatigue, fever and unexpected weight change.  HENT:  Negative for congestion, ear pain, rhinorrhea, sinus pressure and sore throat.   Eyes:  Negative for pain, redness and visual disturbance.  Respiratory:  Negative for cough, shortness of breath and wheezing.   Cardiovascular:  Negative for chest pain and palpitations.  Gastrointestinal:  Negative for abdominal pain, blood in stool, constipation and diarrhea.  Endocrine: Negative for polydipsia and polyuria.  Genitourinary:  Negative for dysuria, frequency and urgency.  Musculoskeletal:  Negative for arthralgias, back pain and myalgias.  Skin:  Negative for pallor and rash.       Skin lesion Itchy   Allergic/Immunologic: Negative for environmental allergies.  Neurological:  Negative for dizziness, syncope and headaches.  Hematological:  Negative for adenopathy. Does not bruise/bleed easily.  Psychiatric/Behavioral:  Negative for decreased concentration and dysphoric mood. The patient is not nervous/anxious.        Objective:   Physical Exam Skin:    Comments: Skin lesion  Left upper back  3-4 mm round raised rough grey to brown lesion /homogenous in color with some excoriation and scale (waxy) Resembles a round SK   Solar lentigines scattered   Stable brown macule left arm  Stable brown nevus right leg              Assessment & Plan:   Problem List Items Addressed This Visit       Musculoskeletal and Integument   Skin lesion - Primary   Left upper back, 3-4 mm grey/brown raised lesion with scale (some irritation from scratching), with c/o itching / new and possibly enlarging Has features of sk   Refer to  dermatology for eval and treatment  Pt will call to schedule  Encouraged to keep clean (soap and water) and dry  Aquaphor /vaseline prn Call if signs and symptoms of infection or other change  Discussed goals for sun protection       Relevant Orders   Ambulatory referral to Dermatology   Other Visit Diagnoses       Need for influenza vaccination       Relevant Orders   Flu vaccine trivalent PF, 6mos and older(Flulaval,Afluria,Fluarix,Fluzone) (Completed)

## 2023-12-05 NOTE — Patient Instructions (Signed)
 I put the referral in for dermatology  Please let us  know if you don't hear in 1-2 weeks to set that up (mychart message or call or letter)  Or you can call them   Do watch your sun exposure and wear sun protection   Try not to scratch   Use some aquaphor or vaseline  Clean with soap and water   Flu shot today

## 2023-12-05 NOTE — Assessment & Plan Note (Addendum)
 Left upper back, 3-4 mm grey/brown raised lesion with scale (some irritation from scratching), with c/o itching / new and possibly enlarging Has features of sk   Refer to dermatology for eval and treatment  Pt will call to schedule  Encouraged to keep clean (soap and water) and dry  Aquaphor /vaseline prn Call if signs and symptoms of infection or other change  Discussed goals for sun protection

## 2024-01-10 ENCOUNTER — Other Ambulatory Visit

## 2024-01-10 ENCOUNTER — Telehealth: Payer: Self-pay | Admitting: Family Medicine

## 2024-01-10 DIAGNOSIS — D500 Iron deficiency anemia secondary to blood loss (chronic): Secondary | ICD-10-CM | POA: Diagnosis not present

## 2024-01-10 DIAGNOSIS — Z0000 Encounter for general adult medical examination without abnormal findings: Secondary | ICD-10-CM

## 2024-01-10 LAB — CBC WITH DIFFERENTIAL/PLATELET
Basophils Absolute: 0.1 K/uL (ref 0.0–0.1)
Basophils Relative: 1 % (ref 0.0–3.0)
Eosinophils Absolute: 0.1 K/uL (ref 0.0–0.7)
Eosinophils Relative: 1.8 % (ref 0.0–5.0)
HCT: 33.7 % — ABNORMAL LOW (ref 36.0–46.0)
Hemoglobin: 11.1 g/dL — ABNORMAL LOW (ref 12.0–15.0)
Lymphocytes Relative: 31.2 % (ref 12.0–46.0)
Lymphs Abs: 2 K/uL (ref 0.7–4.0)
MCHC: 32.9 g/dL (ref 30.0–36.0)
MCV: 84.1 fl (ref 78.0–100.0)
Monocytes Absolute: 0.6 K/uL (ref 0.1–1.0)
Monocytes Relative: 8.8 % (ref 3.0–12.0)
Neutro Abs: 3.7 K/uL (ref 1.4–7.7)
Neutrophils Relative %: 57.2 % (ref 43.0–77.0)
Platelets: 238 K/uL (ref 150.0–400.0)
RBC: 4 Mil/uL (ref 3.87–5.11)
RDW: 15.2 % (ref 11.5–15.5)
WBC: 6.5 K/uL (ref 4.0–10.5)

## 2024-01-10 LAB — IRON: Iron: 56 ug/dL (ref 42–145)

## 2024-01-10 LAB — COMPREHENSIVE METABOLIC PANEL WITH GFR
ALT: 10 U/L (ref 0–35)
AST: 13 U/L (ref 0–37)
Albumin: 4.1 g/dL (ref 3.5–5.2)
Alkaline Phosphatase: 84 U/L (ref 39–117)
BUN: 14 mg/dL (ref 6–23)
CO2: 27 meq/L (ref 19–32)
Calcium: 8.9 mg/dL (ref 8.4–10.5)
Chloride: 106 meq/L (ref 96–112)
Creatinine, Ser: 0.93 mg/dL (ref 0.40–1.20)
GFR: 70.12 mL/min (ref >60.00)
Glucose, Bld: 86 mg/dL (ref 70–99)
Potassium: 4.2 meq/L (ref 3.5–5.1)
Sodium: 140 meq/L (ref 135–145)
Total Bilirubin: 0.4 mg/dL (ref 0.2–1.2)
Total Protein: 7 g/dL (ref 6.0–8.3)

## 2024-01-10 LAB — TSH: TSH: 2.41 u[IU]/mL (ref 0.35–5.50)

## 2024-01-10 LAB — LIPID PANEL
Cholesterol: 141 mg/dL (ref 0–200)
HDL: 53 mg/dL (ref >39.00)
LDL Cholesterol: 76 mg/dL (ref 0–99)
NonHDL: 88.31
Total CHOL/HDL Ratio: 3
Triglycerides: 64 mg/dL (ref 0.0–149.0)
VLDL: 12.8 mg/dL (ref 0.0–40.0)

## 2024-01-10 NOTE — Telephone Encounter (Signed)
-----   Message from Rocky FORBES Chock sent at 01/10/2024  8:19 AM EST ----- Regarding: CPR labs for today 12.5.25 Pt is in the lab for labs, please put in orders. Thank you, Rocky

## 2024-01-12 ENCOUNTER — Ambulatory Visit: Payer: Self-pay | Admitting: Family Medicine

## 2024-01-17 ENCOUNTER — Encounter: Payer: Self-pay | Admitting: Family Medicine

## 2024-01-17 ENCOUNTER — Ambulatory Visit: Admitting: Family Medicine

## 2024-01-17 VITALS — BP 126/82 | HR 76 | Temp 97.8°F | Ht 66.5 in | Wt 184.2 lb

## 2024-01-17 DIAGNOSIS — L989 Disorder of the skin and subcutaneous tissue, unspecified: Secondary | ICD-10-CM

## 2024-01-17 DIAGNOSIS — Z0000 Encounter for general adult medical examination without abnormal findings: Secondary | ICD-10-CM | POA: Diagnosis not present

## 2024-01-17 DIAGNOSIS — D500 Iron deficiency anemia secondary to blood loss (chronic): Secondary | ICD-10-CM | POA: Diagnosis not present

## 2024-01-17 DIAGNOSIS — Z1211 Encounter for screening for malignant neoplasm of colon: Secondary | ICD-10-CM

## 2024-01-17 DIAGNOSIS — K219 Gastro-esophageal reflux disease without esophagitis: Secondary | ICD-10-CM

## 2024-01-17 NOTE — Assessment & Plan Note (Signed)
 Iron  def from menses/suspected  Hb 11.1 and iron  56 Dislikes oral iron   Requests ref to hematology to learn more about anemia and treatment options  No known history of [place] or thal   Still having menses regularly

## 2024-01-17 NOTE — Assessment & Plan Note (Signed)
 Colonoscopy utd 04/2020 with 5 y recall

## 2024-01-17 NOTE — Patient Instructions (Addendum)
 Be as active as you can  Add some strength training to your routine, this is important for bone and brain health and can reduce your risk of falls and help your body use insulin properly and regulate weight  Light weights, exercise bands , and internet videos are a good way to start  Yoga (chair or regular), machines , floor exercises or a gym with machines are also good options   I put the referral in for hematology for anemia  Please let us  know if you don't hear in 1-2 weeks to set that up (mychart message or call or letter)   Take care of yourself  Keep up the good work

## 2024-01-17 NOTE — Progress Notes (Signed)
 Subjective:    Patient ID: Brooke Gill, female    DOB: 14-Mar-1970, 53 y.o.   MRN: [id number]  HPI  Here for health maintenance exam and to review chronic medical problems   Wt Readings from Last 3 Encounters:  01/17/24 184 lb 4 oz (83.6 kg)  12/05/23 185 lb (83.9 kg)  12/11/22 173 lb 4 oz (78.6 kg)   29.29 kg/m  Vitals:   01/17/24 1432  BP: 126/82  Pulse: 76  Temp: 97.8 F (36.6 C)  SpO2: 97%    Immunization History  Administered Date(s) Administered   Influenza Whole 11/06/2007   Influenza, Seasonal, Injecte, Preservative Fre 12/11/2022, 12/05/2023   Influenza,inj,Quad PF,6+ Mos 10/08/2012, 03/07/2017, 11/15/2019, 11/27/2021   Influenza-Unspecified 12/31/2020   Moderna Sars-Covid-2 Vaccination 04/13/2019, 05/11/2019, 12/13/2019   Td 02/06/2000, 12/11/2022   Tdap 06/19/2010    Health Maintenance Due  Topic Date Due   Hepatitis C Screening  Never done   Hepatitis B Vaccines 19-59 Average Risk (1 of 3 - 19+ 3-dose series) Never done   Pneumococcal Vaccine: 50+ Years (1 of 1 - PCV) Never done   Zoster Vaccines- Shingrix (1 of 2) Never done   Mammogram  03/27/2022   Feeling all right   Shingrix vaccine - wants to check insurance coverage   Mammogram recent gyn/normal  Self breast exam-no lumps , no lumps   Gyn health Pap utd Still has period monthly  They can be heavy    Colon cancer screening  Colonoscopy 04/2020 with 5 y recall   Bone health   Falls none  Fractures-none  Supplements -takes pnv  Vitamin D and B12   Exercise  Nothing  Have exercise equip but not inspired to use/needs to prioritize herself   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  Waiting on last excision report  Healing from this      Mood    01/17/2024    2:43 PM 12/05/2023   11:33 AM 11/27/2021   10:07 AM 09/07/2020    4:02 PM 09/07/2019    3:53 PM  Depression screen PHQ 2/9  Decreased Interest 0 0 0 0 0  Down, Depressed, Hopeless 0 0 0 0 0  PHQ - 2 Score 0 0 0 0 0   Altered sleeping 0 0 1 1 1   Tired, decreased energy 0 0 1 1 1   Change in appetite 0 0 0 0 0  Feeling bad or failure about yourself  0 0 0 0 0  Trouble concentrating 0 0 1 0 0  Moving slowly or fidgety/restless 0 0 0 0 0  Suicidal thoughts 0 0 0 0 0  PHQ-9 Score 0 0  3  2  2    Difficult doing work/chores Not difficult at all Not difficult at all  Not difficult at all Not difficult at all     Data saved with a previous flowsheet row definition    Anemia  From heavy menses in past  Lab Results  Component Value Date   WBC 6.5 01/10/2024   HGB 11.1 (L) 01/10/2024   HCT 33.7 (L) 01/10/2024   MCV 84.1 01/10/2024   PLT 238.0 01/10/2024   Lab Results  Component Value Date   IRON  56 01/10/2024   FERRITIN 48.9 09/05/2021      Niferex 150 mg daily three times weekly (does not like taking more often)  GI side effects  Wants referral to hematology   Does not have Balm trait   Lab Results  Component Value  Date   NA 140 01/10/2024   K 4.2 01/10/2024   CO2 27 01/10/2024   GLUCOSE 86 01/10/2024   BUN 14 01/10/2024   CREATININE 0.93 01/10/2024   CALCIUM 8.9 01/10/2024   GFR 70.12 01/10/2024   GFRNONAA >60 09/16/2019   Lab Results  Component Value Date   ALT 10 01/10/2024   AST 13 01/10/2024   ALKPHOS 84 01/10/2024   BILITOT 0.4 01/10/2024    Lab Results  Component Value Date   TSH 2.41 01/10/2024    Cholesterol Lab Results  Component Value Date   CHOL 141 01/10/2024   CHOL 121 11/23/2022   CHOL 124 09/05/2021   Lab Results  Component Value Date   HDL 53.00 01/10/2024   HDL 55.80 11/23/2022   HDL 52.00 09/05/2021   Lab Results  Component Value Date   LDLCALC 76 01/10/2024   LDLCALC 55 11/23/2022   LDLCALC 61 09/05/2021   Lab Results  Component Value Date   TRIG 64.0 01/10/2024   TRIG 50.0 11/23/2022   TRIG 52.0 09/05/2021   Lab Results  Component Value Date   CHOLHDL 3 01/10/2024   CHOLHDL 2 11/23/2022   CHOLHDL 2 09/05/2021   No results found  for: LDLDIRECT      Patient Active Problem List   Diagnosis Date Noted   Skin lesion 12/05/2023   GERD (gastroesophageal reflux disease) 12/11/2022   Palpitations 01/06/2020   Colon cancer screening 09/07/2019   Stress reaction 03/04/2019   Anemia 09/02/2017   Routine general medical examination at a health care facility 06/19/2010   Past Medical History:  Diagnosis Date   Anemia    Past Surgical History:  Procedure Laterality Date   OVARIAN CYST SURGERY     Social History[1] Family History  Problem Relation Age of Onset   Alcohol abuse Father    Cancer Paternal Aunt        breast   Heart disease Paternal Grandmother    Cancer Paternal Grandfather        prostate CA   Colon cancer Paternal Grandfather    Colon polyps Neg Hx    Esophageal cancer Neg Hx    Rectal cancer Neg Hx    Stomach cancer Neg Hx    Allergies[2] Medications Ordered Prior to Encounter[3]  Review of Systems  Constitutional:  Positive for fatigue. Negative for activity change, appetite change, fever and unexpected weight change.  HENT:  Negative for congestion, ear pain, rhinorrhea, sinus pressure and sore throat.   Eyes:  Negative for pain, redness and visual disturbance.  Respiratory:  Negative for cough, shortness of breath and wheezing.   Cardiovascular:  Negative for chest pain and palpitations.  Gastrointestinal:  Negative for abdominal pain, blood in stool, constipation and diarrhea.  Endocrine: Negative for polydipsia and polyuria.  Genitourinary:  Positive for menstrual problem. Negative for dysuria, frequency and urgency.  Musculoskeletal:  Negative for arthralgias, back pain and myalgias.  Skin:  Positive for wound. Negative for pallor and rash.       Healing skin cancer excision   Allergic/Immunologic: Negative for environmental allergies.  Neurological:  Negative for dizziness, syncope and headaches.  Hematological:  Negative for adenopathy. Does not bruise/bleed easily.   Psychiatric/Behavioral:  Negative for decreased concentration and dysphoric mood. The patient is not nervous/anxious.        Objective:   Physical Exam Constitutional:      General: She is not in acute distress.    Appearance: Normal appearance. She is well-developed.  She is not ill-appearing or diaphoretic.  HENT:     Head: Normocephalic and atraumatic.     Right Ear: Tympanic membrane, ear canal and external ear normal.     Left Ear: Tympanic membrane, ear canal and external ear normal.     Nose: Nose normal. No congestion.     Mouth/Throat:     Mouth: Mucous membranes are moist.     Pharynx: Oropharynx is clear. No posterior oropharyngeal erythema.  Eyes:     General: No scleral icterus.    Extraocular Movements: Extraocular movements intact.     Conjunctiva/sclera: Conjunctivae normal.     Pupils: Pupils are equal, round, and reactive to light.  Neck:     Thyroid : No thyromegaly.     Vascular: No carotid bruit or JVD.  Cardiovascular:     Rate and Rhythm: Normal rate and regular rhythm.     Pulses: Normal pulses.     Heart sounds: Normal heart sounds.     No gallop.  Pulmonary:     Effort: Pulmonary effort is normal. No respiratory distress.     Breath sounds: Normal breath sounds. No wheezing.     Comments: Good air exch Chest:     Chest wall: No tenderness.  Abdominal:     General: Bowel sounds are normal. There is no distension or abdominal bruit.     Palpations: Abdomen is soft. There is no mass.     Tenderness: There is no abdominal tenderness.     Hernia: No hernia is present.  Genitourinary:    Comments: Breast and pelvic exam are done by gyn provider   Musculoskeletal:        General: No tenderness. Normal range of motion.     Cervical back: Normal range of motion and neck supple. No rigidity. No muscular tenderness.     Right lower leg: No edema.     Left lower leg: No edema.     Comments: No kyphosis   Lymphadenopathy:     Cervical: No cervical  adenopathy.  Skin:    General: Skin is warm and dry.     Coloration: Skin is not pale.     Findings: No erythema or rash.     Comments: Covered wound on left upper back   Neurological:     Mental Status: She is alert. Mental status is at baseline.     Cranial Nerves: No cranial nerve deficit.     Motor: No abnormal muscle tone.     Coordination: Coordination normal.     Gait: Gait normal.     Deep Tendon Reflexes: Reflexes are normal and symmetric. Reflexes normal.  Psychiatric:        Mood and Affect: Mood normal.        Cognition and Memory: Cognition and memory normal.           Assessment & Plan:   Problem List Items Addressed This Visit       Digestive   GERD (gastroesophageal reflux disease)   Stable Pepcid  prn        Musculoskeletal and Integument   Skin lesion   Saw dermatology Dx with squamous cell carcinoma  Pending results from excision   Encouraged to use sun protectoin         Other   Routine general medical examination at a health care facility - Primary   Reviewed health habits including diet and exercise and skin cancer prevention Reviewed appropriate screening tests for age  Also reviewed  health mt list, fam hx and immunization status , as well as social and family history   See HPI Labs reviewed and ordered Health Maintenance  Topic Date Due   Hepatitis C Screening  Never done   Hepatitis B Vaccine (1 of 3 - 19+ 3-dose series) Never done   Pneumococcal Vaccine for age over 67 (1 of 1 - PCV) Never done   Zoster (Shingles) Vaccine (1 of 2) Never done   Breast Cancer Screening  03/27/2022   COVID-19 Vaccine (4 - 2025-26 season) 02/01/2026*   Pap with HPV screening  03/27/2024   Colon Cancer Screening  04/27/2025   DTaP/Tdap/Td vaccine (4 - Td or Tdap) 12/10/2032   Flu Shot  Completed   HIV Screening  Completed   HPV Vaccine  Aged Out   Meningitis B Vaccine  Aged Out  *Topic was postponed. The date shown is not the original due date.     Sent for mammo and pap report from gyn  Discussed fall prevention, supplements and exercise for bone density  Encouraged to start some strength building exercise  PHQ 0       Colon cancer screening   Colonoscopy utd 04/2020 with 5 y recall      Anemia   Iron  def from menses/suspected  Hb 11.1 and iron  56 Dislikes oral iron   Requests ref to hematology to learn more about anemia and treatment options  No known history of [place] or thal   Still having menses regularly      Relevant Orders   Ambulatory referral to Hematology / Oncology      [1]  Social History Tobacco Use   Smoking status: Never   Smokeless tobacco: Never  Vaping Use   Vaping status: Never Used  Substance Use Topics   Alcohol use: No   Drug use: No  [2]  Allergies Allergen Reactions   Iron      GI side effect  [3]  Current Outpatient Medications on File Prior to Visit  Medication Sig Dispense Refill   famotidine  (PEPCID ) 40 MG tablet Take 1 tablet (40 mg total) by mouth daily. 90 tablet 3   iron  polysaccharides (NIFEREX) 150 MG capsule Take 1 capsule (150 mg total) by mouth daily. With a meal 90 capsule 3   naproxen  (NAPROSYN ) 500 MG tablet Take 1 tablet (500 mg total) by mouth 2 (two) times daily with a meal. 60 tablet 0   PRENATAL VITAMINS PO Take one by mouth every other daily     No current facility-administered medications on file prior to visit.

## 2024-01-17 NOTE — Assessment & Plan Note (Signed)
 Stable Pepcid prn

## 2024-01-17 NOTE — Assessment & Plan Note (Signed)
 Saw dermatology Dx with squamous cell carcinoma  Pending results from excision   Encouraged to use sun protectoin

## 2024-01-17 NOTE — Assessment & Plan Note (Signed)
 Reviewed health habits including diet and exercise and skin cancer prevention Reviewed appropriate screening tests for age  Also reviewed health mt list, fam hx and immunization status , as well as social and family history   See HPI Labs reviewed and ordered Health Maintenance  Topic Date Due   Hepatitis C Screening  Never done   Hepatitis B Vaccine (1 of 3 - 19+ 3-dose series) Never done   Pneumococcal Vaccine for age over 44 (1 of 1 - PCV) Never done   Zoster (Shingles) Vaccine (1 of 2) Never done   Breast Cancer Screening  03/27/2022   COVID-19 Vaccine (4 - 2025-26 season) 02/01/2026*   Pap with HPV screening  03/27/2024   Colon Cancer Screening  04/27/2025   DTaP/Tdap/Td vaccine (4 - Td or Tdap) 12/10/2032   Flu Shot  Completed   HIV Screening  Completed   HPV Vaccine  Aged Out   Meningitis B Vaccine  Aged Out  *Topic was postponed. The date shown is not the original due date.    Sent for mammo and pap report from gyn  Discussed fall prevention, supplements and exercise for bone density  Encouraged to start some strength building exercise  PHQ 0

## 2024-01-27 ENCOUNTER — Inpatient Hospital Stay: Attending: Medical Oncology

## 2024-01-27 ENCOUNTER — Encounter: Payer: Self-pay | Admitting: Medical Oncology

## 2024-01-27 ENCOUNTER — Other Ambulatory Visit: Payer: Self-pay

## 2024-01-27 ENCOUNTER — Inpatient Hospital Stay: Admitting: Medical Oncology

## 2024-01-27 VITALS — BP 120/76 | HR 66 | Temp 98.6°F | Resp 20 | Wt 183.4 lb

## 2024-01-27 DIAGNOSIS — Z791 Long term (current) use of non-steroidal anti-inflammatories (NSAID): Secondary | ICD-10-CM | POA: Diagnosis not present

## 2024-01-27 DIAGNOSIS — Z800 Family history of malignant neoplasm of digestive organs: Secondary | ICD-10-CM | POA: Diagnosis not present

## 2024-01-27 DIAGNOSIS — D649 Anemia, unspecified: Secondary | ICD-10-CM

## 2024-01-27 DIAGNOSIS — D509 Iron deficiency anemia, unspecified: Secondary | ICD-10-CM | POA: Insufficient documentation

## 2024-01-27 DIAGNOSIS — D508 Other iron deficiency anemias: Secondary | ICD-10-CM

## 2024-01-27 DIAGNOSIS — F32A Depression, unspecified: Secondary | ICD-10-CM | POA: Diagnosis not present

## 2024-01-27 LAB — CMP (CANCER CENTER ONLY)
ALT: 9 U/L (ref 0–44)
AST: 16 U/L (ref 15–41)
Albumin: 4.3 g/dL (ref 3.5–5.0)
Alkaline Phosphatase: 100 U/L (ref 38–126)
Anion gap: 12 (ref 5–15)
BUN: 7 mg/dL (ref 6–20)
CO2: 24 mmol/L (ref 22–32)
Calcium: 8.9 mg/dL (ref 8.9–10.3)
Chloride: 103 mmol/L (ref 98–111)
Creatinine: 0.87 mg/dL (ref 0.44–1.00)
GFR, Estimated: >60 mL/min
Glucose, Bld: 88 mg/dL (ref 70–99)
Potassium: 3.8 mmol/L (ref 3.5–5.1)
Sodium: 139 mmol/L (ref 135–145)
Total Bilirubin: 0.4 mg/dL (ref 0.0–1.2)
Total Protein: 7.6 g/dL (ref 6.5–8.1)

## 2024-01-27 LAB — CBC WITH DIFFERENTIAL (CANCER CENTER ONLY)
Abs Immature Granulocytes: 0.08 K/uL — ABNORMAL HIGH (ref 0.00–0.07)
Basophils Absolute: 0.1 K/uL (ref 0.0–0.1)
Basophils Relative: 1 %
Eosinophils Absolute: 0.1 K/uL (ref 0.0–0.5)
Eosinophils Relative: 2 %
HCT: 33.8 % — ABNORMAL LOW (ref 36.0–46.0)
Hemoglobin: 11.1 g/dL — ABNORMAL LOW (ref 12.0–15.0)
Immature Granulocytes: 1 %
Lymphocytes Relative: 35 %
Lymphs Abs: 2.9 K/uL (ref 0.7–4.0)
MCH: 27.6 pg (ref 26.0–34.0)
MCHC: 32.8 g/dL (ref 30.0–36.0)
MCV: 84.1 fL (ref 80.0–100.0)
Monocytes Absolute: 0.6 K/uL (ref 0.1–1.0)
Monocytes Relative: 7 %
Neutro Abs: 4.5 K/uL (ref 1.7–7.7)
Neutrophils Relative %: 54 %
Platelet Count: 259 K/uL (ref 150–400)
RBC: 4.02 MIL/uL (ref 3.87–5.11)
RDW: 14.6 % (ref 11.5–15.5)
WBC Count: 8.3 K/uL (ref 4.0–10.5)
nRBC: 0 % (ref 0.0–0.2)

## 2024-01-27 LAB — VITAMIN B12: Vitamin B-12: 3826 pg/mL — ABNORMAL HIGH (ref 180–914)

## 2024-01-27 LAB — IRON AND IRON BINDING CAPACITY (CC-WL,HP ONLY)
Iron: 86 ug/dL (ref 28–170)
Saturation Ratios: 28 % (ref 10.4–31.8)
TIBC: 311 ug/dL (ref 250–450)
UIBC: 225 ug/dL

## 2024-01-27 LAB — RETIC PANEL
Immature Retic Fract: 11 % (ref 2.3–15.9)
RBC.: 4.12 MIL/uL (ref 3.87–5.11)
Retic Count, Absolute: 42.4 K/uL (ref 19.0–186.0)
Retic Ct Pct: 1 % (ref 0.4–3.1)
Reticulocyte Hemoglobin: 31.2 pg

## 2024-01-27 LAB — FERRITIN: Ferritin: 80 ng/mL (ref 11–307)

## 2024-01-27 LAB — SAVE SMEAR(SSMR), FOR PROVIDER SLIDE REVIEW

## 2024-01-27 LAB — FOLATE: Folate: 5.2 ng/mL — ABNORMAL LOW

## 2024-01-27 NOTE — Progress Notes (Signed)
 " [facility] [phone]   [phone]  INITIAL CONSULT NOTE  Patient Care Team: Tower, Laine LABOR, MD as PCP - General  CHIEF COMPLAINTS/PURPOSE OF CONSULTATION:  Iron  Deficiency anemia  HISTORY OF PRESENTING ILLNESS:  Brooke Gill 53 y.o. female is referred to our office by their PCP Dr. Laine Balls for IDA  Anemia has been present for many decades. She does not believe that she had troubles with anemia during pregnancy.   Blood in stools:No Change of bowel movement/constipation: No Family history of GI cancers: Yes Last Colonoscopy:2022 Last Endoscopy:N/A History of GERD or GI bleed: No UC/Crohn's: No PPI use: No Hiatal Hernia: No NSAID use: Rare use of Naproxen  Blood in urine: No Difficulty swallowing: No Pica: No SOB: No Fatigue: Yes Hair Loss: Mild Prior blood transfusion: No Prior history of blood loss: No Liver disease: No Kidney Disease: No Bariatric/Intestinal surgery: No Frequent Blood Donation: No Vaginal bleeding: Yes- Monthly cycles. They last about 5 days. Average to heavy in nature.  Prior evaluation with hematology: No Prior bone marrow biopsy: No Oral iron : Sometimes taking oral iron - Poor tolerance- constipation  Prior IV iron  infusions: No Family history Anemia: No known sickle cell or thalassemia  No Personal or family history of bleeding or clotting disorders.  Special diets: No No use of GLP-1: No Iron  rich foods in diet: No Eating habits: Normal   She had her first colonoscopy in 2022- she had 2-3 benign polyps removed at that time. She returns in 2 years for her next screening.   She is UTD on mammograms. She has dense breasts and has had clip placements with benign biopsies.   No unintentional weight loss or night sweats.  No new lumps or bumps  MEDICAL HISTORY:  Past Medical History:  Diagnosis Date   Anemia     SURGICAL HISTORY: Past Surgical History:  Procedure Laterality Date   OVARIAN CYST  SURGERY      SOCIAL HISTORY: Social History   Socioeconomic History   Marital status: Married    Spouse name: Not on file   Number of children: Not on file   Years of education: Not on file   Highest education level: Master's degree (e.g., MA, MS, MEng, MEd, MSW, MBA)  Occupational History   Not on file  Tobacco Use   Smoking status: Never   Smokeless tobacco: Never  Vaping Use   Vaping status: Never Used  Substance and Sexual Activity   Alcohol use: No   Drug use: No   Sexual activity: Not Currently  Other Topics Concern   Not on file  Social History Narrative   Not on file   Social Drivers of Health   Tobacco Use: Low Risk (01/27/2024)   Patient History    Smoking Tobacco Use: Never    Smokeless Tobacco Use: Never    Passive Exposure: Not on file  Financial Resource Strain: Patient Declined (01/27/2024)   Overall Financial Resource Strain (CARDIA)    Difficulty of Paying Living Expenses: Patient declined  Food Insecurity: Patient Declined (01/27/2024)   Epic    Worried About Programme Researcher, Broadcasting/film/video in the Last Year: Patient declined    Barista in the Last Year: Patient declined  Transportation Needs: Patient Declined (01/27/2024)   Epic    Lack of Transportation (Medical): Patient declined    Lack of Transportation (Non-Medical): Patient declined  Physical Activity: Patient Declined (01/27/2024)   Exercise Vital Sign    Days  of Exercise per Week: Patient declined    Minutes of Exercise per Session: Patient declined  Stress: Patient Declined (01/27/2024)   [employer] of Occupational Health - Occupational Stress Questionnaire    Feeling of Stress: Patient declined  Social Connections: Unknown (01/27/2024)   Social Connection and Isolation Panel    Frequency of Communication with Friends and Family: Patient declined    Frequency of Social Gatherings with Friends and Family: Patient declined    Attends Religious Services: Patient declined    Active  Member of Clubs or Organizations: Patient declined    Attends Banker Meetings: Patient declined    Marital Status: Not on file  Intimate Partner Violence: Patient Declined (01/27/2024)   Epic    Fear of Current or Ex-Partner: Patient declined    Emotionally Abused: Patient declined    Physically Abused: Patient declined    Sexually Abused: Patient declined  Depression (PHQ2-9): Low Risk (01/27/2024)   Depression (PHQ2-9)    PHQ-2 Score: 0  Alcohol Screen: Low Risk (01/27/2024)   Alcohol Screen    Last Alcohol Screening Score (AUDIT): 0  Housing: Patient Declined (01/27/2024)   Epic    Unable to Pay for Housing in the Last Year: Patient declined    Number of Times Moved in the Last Year: Not on file    Homeless in the Last Year: Patient declined  Recent Concern: Housing - High Risk (01/24/2024)   Epic    Unable to Pay for Housing in the Last Year: Yes    Number of Times Moved in the Last Year: 0    Homeless in the Last Year: No  Utilities: Patient Declined (01/27/2024)   Epic    Threatened with loss of utilities: Patient declined  Health Literacy: Adequate Health Literacy (01/27/2024)   B1300 Health Literacy    Frequency of need for help with medical instructions: Never    FAMILY HISTORY: Family History  Problem Relation Age of Onset   Alcohol abuse Father    Cancer Paternal Aunt        breast   Heart disease Paternal Grandmother    Cancer Paternal Grandfather    Colon cancer Paternal Grandfather    Colon polyps Neg Hx    Esophageal cancer Neg Hx    Rectal cancer Neg Hx    Stomach cancer Neg Hx     ALLERGIES:  is allergic to iron .  MEDICATIONS:  Current Outpatient Medications  Medication Sig Dispense Refill   famotidine  (PEPCID ) 40 MG tablet Take 1 tablet (40 mg total) by mouth daily. 90 tablet 3   iron  polysaccharides (NIFEREX) 150 MG capsule Take 1 capsule (150 mg total) by mouth daily. With a meal 90 capsule 3   naproxen  (NAPROSYN ) 500 MG tablet  Take 1 tablet (500 mg total) by mouth 2 (two) times daily with a meal. 60 tablet 0   PRENATAL VITAMINS PO Take one by mouth every other daily     No current facility-administered medications for this visit.    REVIEW OF SYSTEMS:   Constitutional: ( - ) fevers, ( - )  chills , ( - ) night sweats Eyes: ( - ) blurriness of vision, ( - ) double vision, ( - ) watery eyes Ears, nose, mouth, throat, and face: ( - ) mucositis, ( - ) sore throat Respiratory: ( - ) cough, ( - ) dyspnea, ( - ) wheezes Cardiovascular: ( - ) palpitation, ( - ) chest discomfort, ( - ) lower extremity swelling  Gastrointestinal:  ( - ) nausea, ( - ) heartburn, ( - ) change in bowel habits Skin: ( - ) abnormal skin rashes Lymphatics: ( - ) new lymphadenopathy, ( - ) easy bruising Neurological: ( - ) numbness, ( - ) tingling, ( - ) new weaknesses Behavioral/Psych: ( - ) mood change, ( - ) new changes  All other systems were reviewed with the patient and are negative.  PHYSICAL EXAMINATION: ECOG PERFORMANCE STATUS: 0 - Asymptomatic  Vitals:   01/27/24 1325  BP: 120/76  Pulse: 66  Resp: 20  Temp: 98.6 F (37 C)  SpO2: 100%   Filed Weights   01/27/24 1325  Weight: 183 lb 6.4 oz (83.2 kg)    GENERAL: well appearing female in NAD  SKIN: skin color, texture, turgor are normal, no rashes or significant lesions EYES: conjunctiva are pink and non-injected, sclera clear OROPHARYNX: no exudate, no erythema; lips, buccal mucosa, and tongue normal  NECK: supple, non-tender LYMPH:  no palpable lymphadenopathy in the cervical, axillary or supraclavicular lymph nodes.  LUNGS: clear to auscultation and percussion with normal breathing effort HEART: regular rate & rhythm and no murmurs and no lower extremity edema ABDOMEN: soft, non-tender, non-distended, normal bowel sounds Musculoskeletal: no cyanosis of digits and no clubbing  PSYCH: alert & oriented x 3, fluent speech NEURO: no focal motor/sensory  deficits  LABORATORY DATA:  Pending   BLOOD FILM: Review of the peripheral blood smear showed normal appearing white cells with neutrophils that were appropriately lobated and granulated. There was no predominance of bi-lobed or hyper-segmented neutrophils appreciated. No Dohle bodies were noted. There was no left shifting, immature forms or blasts noted. Lymphocytes remain normal in size without any predominance of large granular lymphocytes. Red cells show no anisopoikilocytosis, macrocytes , microcytes or polychromasia. There were no schistocytes, target cells, echinocytes, acanthocytes, dacrocytes, or stomatocytes.There was no rouleaux formation, nucleated red cells, or intra-cellular inclusions noted. The platelets are normal in size, shape, and color without any clumping evident.  ASSESSMENT & PLAN Brooke Gill is a 53 y.o. female who was referred to us  for iron  deficiency anemia. On review of externa labs shown below she appears to have normocytic anemia   Patient has IDA. Recent external labs from 01/10/2024 show a Hgb of 11.1, MCV 84.1, iron  levels of 56. CMP was normal without evidence of kidney or liver disease. Normal TSH of 2.41. WBC 6.5 and platelet level was 238.   She has a normal RDW with normocytic anemia. No evidence of any chronic liver disease. No jaundice. No palpable splenomegaly.   I discussed our work up and monitoring. Should her iron  levels be low I would suggest Accufer vs IV iron . Her menstrual cycles have been significantly heavy.This appears the most likely cause of her iron  deficiency given the significant of blood loss. No GI symptoms. I have recommended GI evaluation if iron  deficiency continues or GI symptoms develop. For now we discussed IV iron  as the oral iron  does not appear effective. Discussed how this medication is administered along with common potential side effects (headache, nausea, IV complications) along with very low allergic reaction risk. She is  agreeable to start IV therapy. Orders placed and scheduling aware. We will plan for her to follow up in 2 months with labs. She is aware that length of iron  improvement often relates to iron  loss and is very patient dependent. She may need repeat IVF iron  infusions as soon as 2 month or may never need one again.  If iron  levels do not respond as expected we will switch to a different version of IV iron . She will hold her oral iron  while undergoing IV iron  treatments and will restart oral iron  following her last infusion. If iron  deficiency continues despite her progression into menopause I would suggest an endoscopy and capsule study which we discussed.   We will see her in 3 months for follow up or sooner based on lab results  All questions were answered. The patient knows to call the clinic with any problems, questions or concerns.  I have spent a total of 55 minutes minutes of face-to-face and non-face-to-face time, preparing to see the patient, obtaining and/or reviewing separately obtained history, performing a medically appropriate examination, counseling and educating the patient, ordering medications/tests/procedures, referring and communicating with other health care professionals, documenting clinical information in the electronic health record, independently interpreting results and communicating results to the patient, and care coordination.    Lauraine Dais PA-C Department of Hematology/Oncology [facility] at [facility]   "

## 2024-01-29 ENCOUNTER — Ambulatory Visit: Payer: Self-pay | Admitting: Medical Oncology

## 2024-01-29 ENCOUNTER — Other Ambulatory Visit: Payer: Self-pay | Admitting: Medical Oncology

## 2024-01-29 MED ORDER — FOLIC ACID 1 MG PO TABS: 1.0000 mg | ORAL_TABLET | Freq: Every day | ORAL | 3 refills | Status: AC

## 2024-02-04 LAB — BCR-ABL1, CML/ALL, PCR, QUANT
E1A2 Transcript: <0.0032 %
Interpretation (BCRAL):: NEGATIVE
b2a2 transcript: <0.0032 %
b3a2 transcript: <0.0032 %

## 2024-02-28 ENCOUNTER — Encounter: Payer: Self-pay | Admitting: Family

## 2024-04-06 ENCOUNTER — Ambulatory Visit: Admitting: Physician Assistant

## 2024-04-27 ENCOUNTER — Inpatient Hospital Stay: Admitting: Medical Oncology

## 2024-04-27 ENCOUNTER — Inpatient Hospital Stay

## 2025-01-15 ENCOUNTER — Other Ambulatory Visit

## 2025-01-22 ENCOUNTER — Encounter: Admitting: Family Medicine
